# Patient Record
Sex: Male | Born: 1980 | Race: Black or African American | Hispanic: No | Marital: Single | Smoking: Former smoker
Health system: Southern US, Community
[De-identification: ages and names within clinical notes are randomized; demographics above are authoritative.]

## PROBLEM LIST (undated history)

## (undated) DIAGNOSIS — I471 Supraventricular tachycardia, unspecified: Secondary | ICD-10-CM

## (undated) DIAGNOSIS — I10 Essential (primary) hypertension: Secondary | ICD-10-CM

## (undated) DIAGNOSIS — U071 COVID-19: Secondary | ICD-10-CM

## (undated) DIAGNOSIS — E269 Hyperaldosteronism, unspecified: Secondary | ICD-10-CM

## (undated) HISTORY — PX: WISDOM TOOTH EXTRACTION: SHX21

## (undated) HISTORY — DX: COVID-19: U07.1

## (undated) HISTORY — DX: Hyperaldosteronism, unspecified: E26.9

---

## 2001-03-27 ENCOUNTER — Emergency Department (HOSPITAL_COMMUNITY): Admission: EM | Admit: 2001-03-27 | Discharge: 2001-03-27 | Payer: Self-pay | Admitting: Emergency Medicine

## 2001-05-11 ENCOUNTER — Emergency Department (HOSPITAL_COMMUNITY): Admission: EM | Admit: 2001-05-11 | Discharge: 2001-05-11 | Payer: Self-pay | Admitting: Emergency Medicine

## 2001-06-29 ENCOUNTER — Encounter: Payer: Self-pay | Admitting: Emergency Medicine

## 2001-06-29 ENCOUNTER — Emergency Department (HOSPITAL_COMMUNITY): Admission: EM | Admit: 2001-06-29 | Discharge: 2001-06-29 | Payer: Self-pay | Admitting: Emergency Medicine

## 2001-06-30 ENCOUNTER — Encounter: Payer: Self-pay | Admitting: Emergency Medicine

## 2001-06-30 ENCOUNTER — Ambulatory Visit (HOSPITAL_COMMUNITY): Admission: RE | Admit: 2001-06-30 | Discharge: 2001-06-30 | Payer: Self-pay | Admitting: Emergency Medicine

## 2003-06-18 ENCOUNTER — Emergency Department (HOSPITAL_COMMUNITY): Admission: EM | Admit: 2003-06-18 | Discharge: 2003-06-19 | Payer: Self-pay | Admitting: Emergency Medicine

## 2004-06-10 ENCOUNTER — Emergency Department (HOSPITAL_COMMUNITY): Admission: EM | Admit: 2004-06-10 | Discharge: 2004-06-10 | Payer: Self-pay | Admitting: Emergency Medicine

## 2005-04-12 ENCOUNTER — Emergency Department (HOSPITAL_COMMUNITY): Admission: EM | Admit: 2005-04-12 | Discharge: 2005-04-12 | Payer: Self-pay | Admitting: Emergency Medicine

## 2006-12-19 ENCOUNTER — Emergency Department (HOSPITAL_COMMUNITY): Admission: EM | Admit: 2006-12-19 | Discharge: 2006-12-19 | Payer: Self-pay | Admitting: Emergency Medicine

## 2008-01-21 ENCOUNTER — Emergency Department (HOSPITAL_COMMUNITY): Admission: EM | Admit: 2008-01-21 | Discharge: 2008-01-21 | Payer: Self-pay | Admitting: Emergency Medicine

## 2011-06-05 ENCOUNTER — Emergency Department (HOSPITAL_COMMUNITY)
Admission: EM | Admit: 2011-06-05 | Discharge: 2011-06-05 | Disposition: A | Discharge status: Home / Self Care | Payer: Self-pay | Attending: Emergency Medicine | Admitting: Emergency Medicine

## 2011-06-05 ENCOUNTER — Emergency Department (HOSPITAL_COMMUNITY): Payer: Self-pay

## 2011-06-05 ENCOUNTER — Encounter (HOSPITAL_COMMUNITY): Payer: Self-pay

## 2011-06-05 DIAGNOSIS — R110 Nausea: Secondary | ICD-10-CM | POA: Insufficient documentation

## 2011-06-05 DIAGNOSIS — R05 Cough: Secondary | ICD-10-CM | POA: Insufficient documentation

## 2011-06-05 DIAGNOSIS — J3489 Other specified disorders of nose and nasal sinuses: Secondary | ICD-10-CM | POA: Insufficient documentation

## 2011-06-05 DIAGNOSIS — R059 Cough, unspecified: Secondary | ICD-10-CM | POA: Insufficient documentation

## 2011-06-05 DIAGNOSIS — K5289 Other specified noninfective gastroenteritis and colitis: Secondary | ICD-10-CM | POA: Insufficient documentation

## 2011-06-05 DIAGNOSIS — R6883 Chills (without fever): Secondary | ICD-10-CM | POA: Insufficient documentation

## 2011-06-05 DIAGNOSIS — Z79899 Other long term (current) drug therapy: Secondary | ICD-10-CM | POA: Insufficient documentation

## 2011-06-05 DIAGNOSIS — IMO0001 Reserved for inherently not codable concepts without codable children: Secondary | ICD-10-CM | POA: Insufficient documentation

## 2011-06-05 DIAGNOSIS — K529 Noninfective gastroenteritis and colitis, unspecified: Secondary | ICD-10-CM

## 2011-06-05 MED ORDER — PROMETHAZINE-DM 6.25-15 MG/5ML PO SYRP: ORAL_SOLUTION | ORAL | Status: DC

## 2011-06-05 MED ORDER — PSEUDOEPHEDRINE HCL 60 MG PO TABS: ORAL_TABLET | ORAL | Status: DC

## 2011-06-05 MED ORDER — PREDNISONE 20 MG PO TABS
60.0000 mg | ORAL_TABLET | Freq: Once | ORAL | Status: AC
  Administered 2011-06-05: 60 mg via ORAL
  Filled 2011-06-05: qty 3

## 2011-06-05 MED ORDER — DEXAMETHASONE 4 MG PO TABS: ORAL_TABLET | ORAL | Status: AC

## 2011-06-05 MED ORDER — SODIUM CHLORIDE 0.9 % IV BOLUS (SEPSIS)
1000.0000 mL | Freq: Once | INTRAVENOUS | Status: AC
  Administered 2011-06-05: 1000 mL via INTRAVENOUS

## 2011-06-05 MED ORDER — PSEUDOEPHEDRINE HCL 60 MG PO TABS
60.0000 mg | ORAL_TABLET | Freq: Once | ORAL | Status: AC
  Administered 2011-06-05: 60 mg via ORAL
  Filled 2011-06-05: qty 1

## 2011-06-05 MED ORDER — HYDROCODONE-ACETAMINOPHEN 5-325 MG PO TABS
1.0000 | ORAL_TABLET | Freq: Once | ORAL | Status: AC
  Administered 2011-06-05: 1 via ORAL
  Filled 2011-06-05: qty 1

## 2011-06-05 MED ORDER — HYDROCODONE-ACETAMINOPHEN 5-325 MG PO TABS: 1.0000 | ORAL_TABLET | ORAL | Status: AC | PRN

## 2011-06-05 MED ORDER — PROMETHAZINE HCL 12.5 MG PO TABS
12.5000 mg | ORAL_TABLET | Freq: Once | ORAL | Status: AC
  Administered 2011-06-05: 12.5 mg via ORAL
  Filled 2011-06-05: qty 1

## 2011-06-05 MED ORDER — IBUPROFEN 800 MG PO TABS
800.0000 mg | ORAL_TABLET | Freq: Once | ORAL | Status: AC
  Administered 2011-06-05: 800 mg via ORAL
  Filled 2011-06-05: qty 1

## 2011-06-05 NOTE — ED Provider Notes (Addendum)
History     CSN: [id number]  Arrival date & time 06/05/11  1435   First MD Initiated Contact with Patient 06/05/11 1550      Chief Complaint  Patient presents with  . Generalized Body Aches  . Nausea  . Chills  . Cough    (Consider location/radiation/quality/duration/timing/severity/associated sxs/prior treatment) Patient is a 31 y.o. male presenting with cough. The history is provided by the patient.  Cough This is a new problem. The current episode started more than 2 days ago. The problem occurs constantly. The cough is non-productive. The maximum temperature recorded prior to his arrival was 101 to 101.9 F. Associated symptoms include chills, sweats, headaches and myalgias. Pertinent negatives include no chest pain, no shortness of breath and no wheezing. Associated symptoms comments: Nausea/vomiting. He has tried nothing for the symptoms. He is not a smoker. His past medical history is significant for bronchitis.    History reviewed. No pertinent past medical history.  History reviewed. No pertinent past surgical history.  No family history on file.  History  Substance Use Topics  . Smoking status: Never Smoker   . Smokeless tobacco: Not on file  . Alcohol Use: No      Review of Systems  Constitutional: Positive for chills. Negative for activity change.       All ROS Neg except as noted in HPI  HENT: Negative for nosebleeds and neck pain.   Eyes: Negative for photophobia and discharge.  Respiratory: Positive for cough. Negative for shortness of breath and wheezing.   Cardiovascular: Negative for chest pain and palpitations.  Gastrointestinal: Negative for abdominal pain and blood in stool.  Genitourinary: Negative for dysuria, frequency and hematuria.  Musculoskeletal: Positive for myalgias. Negative for back pain and arthralgias.  Skin: Negative.   Neurological: Positive for headaches. Negative for dizziness, seizures and speech difficulty.    Psychiatric/Behavioral: Negative for hallucinations and confusion.    Allergies  Penicillins  Home Medications   Current Outpatient Rx  Name Route Sig Dispense Refill  . DEXAMETHASONE 4 MG PO TABS  1 po daily with food 6 tablet 0  . HYDROCODONE-ACETAMINOPHEN 5-325 MG PO TABS Oral Take 1 tablet by mouth every 4 (four) hours as needed for pain. 15 tablet 0  . PROMETHAZINE-DM 6.25-15 MG/5ML PO SYRP  5 ml po q6h prn cough 120 mL 0  . PSEUDOEPHEDRINE HCL 60 MG PO TABS  1 po tid for congestion. 30 tablet 0    BP 136/82  Pulse 122  Temp(Src) 100.1 F (37.8 C) (Oral)  Resp 20  Ht 6' (1.829 m)  Wt 270 lb (122.471 kg)  BMI 36.62 kg/m2  SpO2 96%  Physical Exam  Nursing note and vitals reviewed. Constitutional: He is oriented to person, place, and time. He appears well-developed and well-nourished.  Non-toxic appearance.  HENT:  Head: Normocephalic.  Right Ear: Tympanic membrane and external ear normal.  Left Ear: Tympanic membrane and external ear normal.       Nasal congestion  Eyes: EOM and lids are normal. Pupils are equal, round, and reactive to light.  Neck: Normal range of motion. Neck supple. Carotid bruit is not present.  Cardiovascular: Regular rhythm, normal heart sounds, intact distal pulses and normal pulses.  Tachycardia present.   Pulmonary/Chest: No respiratory distress. He has rhonchi. He has no rales.       Few scattered rhonchi.  Abdominal: Soft. Bowel sounds are normal. There is no tenderness. There is no guarding.  Musculoskeletal: Normal range of motion.  Lymphadenopathy:       Head (right side): No submandibular adenopathy present.       Head (left side): No submandibular adenopathy present.    He has no cervical adenopathy.  Neurological: He is alert and oriented to person, place, and time. He has normal strength. No cranial nerve deficit or sensory deficit.  Skin: Skin is warm and dry.  Psychiatric: He has a normal mood and affect. His speech is normal.     ED Course  Procedures (including critical care time) Pulse oximetry 96% on room air. Within normal limits by my interpretation. Labs Reviewed - No data to display No results found.   1. Gastroenteritis       MDM  I have reviewed nursing notes, vital signs, and all appropriate lab and imaging results for this patient. Patient presented with a three-day history of bodyaches cough, congestion, nausea, and vomiting. He denies any hemoptysis or hematemesis. His highest temperature was 102 a couple days ago. He has been able to keep some liquids down at home, but not eating. Patient is ambulatory but feels weak. Patient feels he can keep liquids down if he has medication for the nausea. Prescription for Promethazine cough med. every 6 hours given. Hydrocodone 5 mg every 4 hours. Sudafed 60 mg 3 times a day and Decadron 4 mg daily also given. Patient advised to return to the emergency department if not improving or any complications.  At discharge, temp went up to 102. 1 liter of IV fluids given. Ibuprofen 800mg  given for fever. Chest xray obtained.  After IV fluids and ibuprofen, the temp and pulse rate improved. Chest xray reveals bronchitis changes only. It is safe for pt to be discharged. Pt to return if any changes or problem.    Kathie Dike, PA 06/05/11 1706  Kathie Dike, PA 06/06/11 7829  Kathie Dike, PA 06/15/11 5621

## 2011-06-05 NOTE — ED Notes (Signed)
Pt presents with productive cough, body aches, chills, and nausea since Tuesday.

## 2011-06-05 NOTE — ED Notes (Signed)
Sick for 3-4 days, cough,nausea, body aches, esp in low back.  Says he is able to drink flds but cannot  Eat due to nausea.

## 2011-06-05 NOTE — Discharge Instructions (Signed)
Please increase fluids. Please wash hands frequently. Decadron one tablet daily. Sudafed 60 mg 3 times daily for nasal congestion. Promethazine cough medication every 6 hours. Norco for body aches not improved by Tylenol or ibuprofen. The Norco and promethazine cough medication may cause drowsiness, please use with caution. Her a will and a little driver a to the a horrorsClear Liquid Diet The clear liquid dietconsists of foods that are liquid or will become liquid at room temperature.You should be able to see through the liquid and beverages. Examples of foods allowed on a clear liquid diet include fruit juice, broth or bouillon, gelatin, or frozen ice pops. The purpose of this diet is to provide necessary fluid, electrolytes such as sodium and potassium, and energy to keep the body functioning during times when you are not able to consume a regular diet.A clear liquid diet should not be continued for long periods of time as it is not nutritionally adequate.  REASONS FOR USING A CLEAR LIQUID DIET  In sudden onset (acute) conditions for a patient before or after surgery.   As the first step in oral feeding.   For fluid and electrolyte replacement in diarrheal diseases.   As a diet before certain medical tests are performed.  ADEQUACY The clear liquid diet is adequate only in ascorbic acid, according to the Recommended Dietary Allowances of the [employer]. CHOOSING FOODS Breads and Starches  Allowed:  None are allowed.   Avoid: All are avoided.  Vegetables  Allowed:  Strained tomato or vegetable juice.   Avoid: Any others.  Fruit  Allowed:  Strained fruit juices and fruit drinks. Include 1 serving of citrus or vitamin C-enriched fruit juice daily.   Avoid: Any others.  Meat and Meat Substitutes  Allowed:  None are allowed.   Avoid: All are avoided.  Milk  Allowed:  None are allowed.   Avoid: All are avoided.  Soups and Combination Foods  Allowed:  Clear  bouillon, broth, or strained broth-based soups.   Avoid: Any others.  Desserts and Sweets  Allowed:  Sugar, honey. High protein gelatin. Flavored gelatin, ices, or frozen ice pops that do not contain milk.   Avoid: Any others.  Fats and Oils  Allowed:  None are allowed.   Avoid: All are avoided.  Beverages  Allowed: Cereal beverages, coffee (regular or decaffeinated), tea, or soda at the discretion of your caregiver.   Avoid: Any others.  Condiments  Allowed:  Iodized salt.   Avoid: Any others, including pepper.  Supplements  Allowed:  Liquid nutrition beverages.   Avoid: Any others that contain lactose or fiber.  SAMPLE MEAL PLAN Breakfast  4 oz (120 mL) strained orange juice.    to 1 cup (125 to 250 mL) gelatin (plain or fortified).   1 cup (250 mL) beverage (coffee or tea).   Sugar, if desired.  Midmorning Snack   cup (125 mL) gelatin (plain or fortified).  Lunch  1 cup (250 mL) broth or consomm.   4 oz (120 mL) strained grapefruit juice.    cup (125 mL) gelatin (plain or fortified).   1 cup (250 mL) beverage (coffee or tea).   Sugar, if desired.  Midafternoon Snack   cup (125 mL) fruit ice.    cup (125 mL) strained fruit juice.  Dinner  1 cup (250 mL) broth or consomm.    cup (125 mL) cranberry juice.    cup (125 mL) flavored gelatin (plain or fortified).   1 cup (250 mL)  beverage (coffee or tea).   Sugar, if desired.  Evening Snack  4 oz (120 mL) strained apple juice (vitamin C-fortified).    cup (125 mL) flavored gelatin (plain or fortified).  Document Released: 03/16/2005 Document Revised: 03/05/2011 Document Reviewed: 06/13/2010 [facility] Patient Information 2012 [place], [place].

## 2011-06-06 NOTE — ED Provider Notes (Signed)
History     CSN: [id number]  Arrival date & time 06/05/11  1435   First MD Initiated Contact with Patient 06/05/11 1550      Chief Complaint  Patient presents with  . Generalized Body Aches  . Nausea  . Chills  . Cough    (Consider location/radiation/quality/duration/timing/severity/associated sxs/prior treatment) HPI  History reviewed. No pertinent past medical history.  History reviewed. No pertinent past surgical history.  No family history on file.  History  Substance Use Topics  . Smoking status: Never Smoker   . Smokeless tobacco: Not on file  . Alcohol Use: No      Review of Systems  Allergies  Penicillins  Home Medications   Current Outpatient Rx  Name Route Sig Dispense Refill  . DEXAMETHASONE 4 MG PO TABS  1 po daily with food 6 tablet 0  . HYDROCODONE-ACETAMINOPHEN 5-325 MG PO TABS Oral Take 1 tablet by mouth every 4 (four) hours as needed for pain. 15 tablet 0  . PROMETHAZINE-DM 6.25-15 MG/5ML PO SYRP  5 ml po q6h prn cough 120 mL 0  . PSEUDOEPHEDRINE HCL 60 MG PO TABS  1 po tid for congestion. 30 tablet 0    BP 129/75  Pulse 112  Temp(Src) 99.5 F (37.5 C) (Oral)  Resp 20  Ht 6' (1.829 m)  Wt 270 lb (122.471 kg)  BMI 36.62 kg/m2  SpO2 96%  Physical Exam  ED Course  Procedures (including critical care time)  Labs Reviewed - No data to display Dg Chest 2 View  06/05/2011  *RADIOLOGY REPORT*  Clinical Data: Cough and congestion.  CHEST - 2 VIEW  Comparison: 04/12/2005.  Findings: The cardiac silhouette, mediastinal and hilar contours are within normal limits and stable.  The lungs are clear of infiltrate or effusion.  There is mild peribronchial thickening which could suggest bronchitis.  No pleural effusion.  The bony thorax is intact.  IMPRESSION: Mild bronchitic changes could suggest bronchitis.  No focal infiltrates.  Original Report Authenticated By: P. Loralie Champagne, M.D.     1. Gastroenteritis       MDM  Duplicate  note        Doug Sou, MD 06/06/11 [phone]

## 2011-06-07 NOTE — ED Provider Notes (Signed)
Medical screening examination/treatment/procedure(s) were performed by non-physician practitioner and as supervising physician I was immediately available for consultation/collaboration.  Doug Sou, MD 06/07/11 [phone]

## 2011-06-15 NOTE — ED Provider Notes (Signed)
Medical screening examination/treatment/procedure(s) were performed by non-physician practitioner and as supervising physician I was immediately available for consultation/collaboration.  Doug Sou, MD 06/15/11 [phone]

## 2019-08-09 ENCOUNTER — Other Ambulatory Visit: Payer: Self-pay

## 2019-08-09 ENCOUNTER — Encounter (HOSPITAL_COMMUNITY): Payer: Self-pay | Admitting: Emergency Medicine

## 2019-08-09 ENCOUNTER — Emergency Department (HOSPITAL_COMMUNITY)
Admission: EM | Admit: 2019-08-09 | Discharge: 2019-08-10 | Disposition: A | Discharge status: Home / Self Care | Payer: Self-pay | Attending: Emergency Medicine | Admitting: Emergency Medicine

## 2019-08-09 DIAGNOSIS — Z5321 Procedure and treatment not carried out due to patient leaving prior to being seen by health care provider: Secondary | ICD-10-CM | POA: Insufficient documentation

## 2019-08-09 DIAGNOSIS — I10 Essential (primary) hypertension: Secondary | ICD-10-CM | POA: Insufficient documentation

## 2019-08-09 DIAGNOSIS — R519 Headache, unspecified: Secondary | ICD-10-CM | POA: Insufficient documentation

## 2019-08-09 NOTE — ED Provider Notes (Signed)
Patient eloped from the emergency department before provider evaluation. I did not see or evaluate this patient. Triage note states "Patient states that his blood pressure has been high today and states that he took a friends blood pressure. Patient states severe headache at this time."  Patient's blood pressure in triage was 150/84.    Kathyrn Lass 08/09/19 2310    Bethann Berkshire, MD 08/09/19 2316

## 2019-08-09 NOTE — ED Triage Notes (Signed)
Patient states that his blood pressure has been high today and states that he took a friends blood pressure. Patient states severe headache at this time.

## 2020-04-11 ENCOUNTER — Ambulatory Visit: Payer: Self-pay

## 2020-04-22 ENCOUNTER — Emergency Department (HOSPITAL_COMMUNITY)
Admission: EM | Admit: 2020-04-22 | Discharge: 2020-04-22 | Disposition: A | Discharge status: Home / Self Care | Payer: Self-pay | Attending: Emergency Medicine | Admitting: Emergency Medicine

## 2020-04-22 ENCOUNTER — Encounter (HOSPITAL_COMMUNITY): Payer: Self-pay | Admitting: Emergency Medicine

## 2020-04-22 ENCOUNTER — Other Ambulatory Visit: Payer: Self-pay

## 2020-04-22 ENCOUNTER — Emergency Department (HOSPITAL_COMMUNITY): Payer: Self-pay

## 2020-04-22 DIAGNOSIS — U071 COVID-19: Secondary | ICD-10-CM | POA: Insufficient documentation

## 2020-04-22 LAB — POC SARS CORONAVIRUS 2 AG -  ED
SARS Coronavirus 2 Ag: POSITIVE — AB
SARS Coronavirus 2 Ag: POSITIVE — AB

## 2020-04-22 MED ORDER — ALBUTEROL SULFATE HFA 108 (90 BASE) MCG/ACT IN AERS
2.0000 | INHALATION_SPRAY | Freq: Once | RESPIRATORY_TRACT | Status: AC
Start: 2020-04-22 — End: 2020-04-22
  Administered 2020-04-22: 2 via RESPIRATORY_TRACT
  Filled 2020-04-22: qty 6.7

## 2020-04-22 MED ORDER — IPRATROPIUM BROMIDE HFA 17 MCG/ACT IN AERS: 2.0000 | INHALATION_SPRAY | Freq: Once | RESPIRATORY_TRACT | Status: DC

## 2020-04-22 NOTE — ED Triage Notes (Signed)
Pt c/o of a productive cough and sob with ambulation since thursday

## 2020-04-22 NOTE — ED Notes (Signed)
Per provider order, patient ambulated with continuous pulse oximetry. This RN notes that patient appears short of breath, and patient reports chest tightness. SpO2 on room air maintains 95-98% and heart rate increased to 118bpm. Pt denies chest pain, dizziness or lightheadedness. PA made aware.

## 2020-04-22 NOTE — Discharge Instructions (Addendum)
You have tested POSITIVE for COVID 19 today. It is recommended that you self isolate at home for 5 days and if you are starting to feel better after day 5 you can resume normal daily activity with mask wearing at all times for an additional 5 days (per new CDC guidelines).   Use the albuterol inhaler as needed for your shortness of breath. Continue using OTC medications for your other symptoms. Drink plenty of fluids to stay hydrated.   It is recommended that you buy a pulse oximeter (can be bought off of Amazon or at a local store if in stock). If your oxygen level is persistently in the 80s at rest you will need to come back to the ED IMMEDIATELY for further evaluation.   Follow up with your PCP for same. If you do not have one you can follow up with [facility] for primary care needs.   Return to the ED for any worsening symptoms including worsening shortness of breath, severe chest pain, passing out, lips/fingers turning blue, inability to awaken easily, new onset confusion, or any other new/concerning symptoms.

## 2020-04-22 NOTE — ED Provider Notes (Signed)
[facility] EMERGENCY DEPARTMENT Provider Note   CSN: [id number] Arrival date & time: 04/22/20  0915     History Chief Complaint  Patient presents with  . Cough    Anthony Huff is a 40 y.o. male who presents to the ED today with complaint of fatigue, cough, and dyspnea on exertion for the past 4-5 days. Pt reports that his whole family is sick currently however none of them have been tested for COVID 19. His wife and sons were going to get tested around 1 PM today. Pt went to [facility] this morning to get medication for his wife given she is having symptoms when he felt like he could not catch his breath causing him concern; pt drove himself to the ED for further evaluation. He is vaccinated x 2 with most recent dose in July; no booster. Pt denies fevers, chills, chest pain, nausea, vomiting, diarrhea, or any other associated symptoms. Pt is a former smoker - quit smoking 1-2 months ago.   The history is provided by the patient and medical records.       History reviewed. No pertinent past medical history.  There are no problems to display for this patient.   History reviewed. No pertinent surgical history.     History reviewed. No pertinent family history.  Social History   Tobacco Use  . Smoking status: Never Smoker  . Smokeless tobacco: Never Used  Substance Use Topics  . Alcohol use: No  . Drug use: Not Currently    Types: Marijuana    Home Medications Prior to Admission medications   Medication Sig Start Date End Date Taking? Authorizing Provider  promethazine-dextromethorphan (PROMETHAZINE-DM) 6.25-15 MG/5ML syrup 5 ml po q6h prn cough 06/05/11   Ivery Quale, PA-C  pseudoephedrine (SUDAFED) 60 MG tablet 1 po tid for congestion. 06/05/11   Ivery Quale, PA-C    Allergies    Penicillins  Review of Systems   Review of Systems  Constitutional: Negative for chills and fever.  Respiratory: Positive for cough and shortness of breath.   Cardiovascular:  Negative for chest pain.  All other systems reviewed and are negative.   Physical Exam Updated Vital Signs BP (!) 154/104 (BP Location: Right Arm)   Pulse 100   Temp 97.9 F (36.6 C) (Oral)   Resp 18   Ht 6' (1.829 m)   Wt (!) 154.2 kg   SpO2 96%   BMI 46.11 kg/m   Physical Exam Vitals and nursing note reviewed.  Constitutional:      Appearance: He is obese. He is not ill-appearing or diaphoretic.     Comments: Tearful on exam  HENT:     Head: Normocephalic and atraumatic.  Eyes:     Conjunctiva/sclera: Conjunctivae normal.  Cardiovascular:     Rate and Rhythm: Normal rate and regular rhythm.     Pulses: Normal pulses.  Pulmonary:     Effort: Pulmonary effort is normal.     Breath sounds: Normal breath sounds. No stridor. No wheezing, rhonchi or rales.     Comments: Able to speak in full sentences without difficulty. Satting 96% on RA at rest. Pt was ambulated with O2 sats remaining above 94%. LCTAB. No active cough in the room.  Chest:     Chest wall: No tenderness.  Skin:    General: Skin is warm and dry.     Coloration: Skin is not jaundiced.  Neurological:     Mental Status: He is alert.     ED  Results / Procedures / Treatments   Labs (all labs ordered are listed, but only abnormal results are displayed) Labs Reviewed  POC SARS CORONAVIRUS 2 AG -  ED - Abnormal; Notable for the following components:      Result Value   SARS Coronavirus 2 Ag POSITIVE (*)    All other components within normal limits  POC SARS CORONAVIRUS 2 AG -  ED - Abnormal; Notable for the following components:   SARS Coronavirus 2 Ag POSITIVE (*)    All other components within normal limits    EKG None  Radiology DG Chest Port 1 View  Result Date: 04/22/2020 CLINICAL DATA:  Productive cough and shortness of breath. COVID positive today. EXAM: PORTABLE CHEST 1 VIEW COMPARISON:  06/05/2011. FINDINGS: Trachea is midline. Heart size is accentuated by AP apical lordotic technique. Lungs  are clear. No pleural fluid. IMPRESSION: No acute findings. Electronically Signed   By: Leanna Battles M.D.   On: 04/22/2020 10:20    Procedures Procedures   Medications Ordered in ED Medications  albuterol (VENTOLIN HFA) 108 (90 Base) MCG/ACT inhaler 2 puff (has no administration in time range)    ED Course  I have reviewed the triage vital signs and the nursing notes.  Pertinent labs & imaging results that were available during my care of the patient were reviewed by me and considered in my medical decision making (see chart for details).  Clinical Course as of 04/22/20 1116  Mon Apr 22, 2020  1009 SARS Coronavirus 2 Ag(!): POSITIVE [MV]    Clinical Course User Index [MV] [name], [place]   MDM Rules/Calculators/A&P                          40 year old male who presents to the ED today with complaint of cough, dyspnea on exertion, fatigue for the past several days.  Was at [facility] earlier today getting medication for his family members who are also sick when he felt like he could not get a deep breath and prompting him to come to the ED.  He was able to drive himself here without difficulty.  On arrival vitals are stable.  Patient is afebrile, nontachypneic.  Heart rate at 100 bpm, has decreased down to the 90s on exam.  Satting 96% on room air.  On exam patient appears to be in no acute distress however is tearful on exam.  He states he is worked up because his whole family is sick including his 89-month-old child.  He is able speak in full sentences without difficulty and his lungs are clear to auscultation bilaterally.  A point-of-care Covid test has been obtained while patient was in triage which has returned positive at this time.  Chest x-ray was also obtained which does not show any acute findings.  I personally ambulated patient in the room with pulse ox on his finger and he stayed above 94% on room air.  I do not feel patient needs additional labs or imaging at this time.   Will provide an albuterol inhaler given his complaints and discharged home with instructions to self isolate.  Patient is advised to follow-up with his PCP for same.  Strict return precautions have been discussed.  He is in agreement with plan and stable for discharge.   This note was prepared using Dragon voice recognition software and may include unintentional dictation errors due to the inherent limitations of voice recognition software.  [name] was evaluated  in Emergency Department on 04/22/2020 for the symptoms described in the history of present illness. He was evaluated in the context of the global COVID-19 pandemic, which necessitated consideration that the patient might be at risk for infection with the SARS-CoV-2 virus that causes COVID-19. Institutional protocols and algorithms that pertain to the evaluation of patients at risk for COVID-19 are in a state of rapid change based on information released by regulatory bodies including the CDC and federal and state organizations. These policies and algorithms were followed during the patient's care in the ED.  Final Clinical Impression(s) / ED Diagnoses Final diagnoses:  COVID-19    Rx / DC Orders ED Discharge Orders    None       Discharge Instructions     You have tested POSITIVE for COVID 19 today. It is recommended that you self isolate at home for 5 days and if you are starting to feel better after day 5 you can resume normal daily activity with mask wearing at all times for an additional 5 days (per new CDC guidelines).   Use the albuterol inhaler as needed for your shortness of breath. Continue using OTC medications for your other symptoms. Drink plenty of fluids to stay hydrated.   It is recommended that you buy a pulse oximeter (can be bought off of [employer] or at a local store if in stock). If your oxygen level is persistently in the 80s at rest you will need to come back to the ED IMMEDIATELY for further evaluation.    Follow up with your PCP for same. If you do not have one you can follow up with [facility] for primary care needs.   Return to the ED for any worsening symptoms including worsening shortness of breath, severe chest pain, passing out, lips/fingers turning blue, inability to awaken easily, new onset confusion, or any other new/concerning symptoms.        [name], PA-C 04/22/20 1116    Vanetta Mulders, MD 05/05/20 [phone]

## 2020-04-22 NOTE — ED Notes (Signed)
Entered room and introduced self to patient. Pt appears to be resting in bed, respirations are even and unlabored with equal chest rise and fall. Bed is locked in the lowest position, side rails x2, call bell within reach. Pt educated on call light use and hourly rounding, verbalized understanding and in agreement at this time. All questions and concerns voiced addressed. Refreshments offered and provided per patient request.  Will continue to monitor.   

## 2020-05-03 ENCOUNTER — Ambulatory Visit
Admission: EM | Admit: 2020-05-03 | Discharge: 2020-05-03 | Disposition: A | Discharge status: Home / Self Care | Payer: Self-pay

## 2020-05-03 ENCOUNTER — Other Ambulatory Visit: Payer: Self-pay

## 2020-08-04 ENCOUNTER — Emergency Department (HOSPITAL_COMMUNITY): Payer: Self-pay

## 2020-08-04 ENCOUNTER — Emergency Department (HOSPITAL_COMMUNITY)
Admission: EM | Admit: 2020-08-04 | Discharge: 2020-08-04 | Disposition: A | Discharge status: Home / Self Care | Payer: Self-pay | Attending: Emergency Medicine | Admitting: Emergency Medicine

## 2020-08-04 ENCOUNTER — Other Ambulatory Visit: Payer: Self-pay

## 2020-08-04 ENCOUNTER — Encounter (HOSPITAL_COMMUNITY): Payer: Self-pay | Admitting: Emergency Medicine

## 2020-08-04 DIAGNOSIS — E876 Hypokalemia: Secondary | ICD-10-CM

## 2020-08-04 DIAGNOSIS — R0602 Shortness of breath: Secondary | ICD-10-CM | POA: Insufficient documentation

## 2020-08-04 DIAGNOSIS — R000 Tachycardia, unspecified: Secondary | ICD-10-CM | POA: Insufficient documentation

## 2020-08-04 LAB — BASIC METABOLIC PANEL
Anion gap: 10 (ref 5–15)
BUN: 7 mg/dL (ref 6–20)
CO2: 26 mmol/L (ref 22–32)
Calcium: 9 mg/dL (ref 8.9–10.3)
Chloride: 105 mmol/L (ref 98–111)
Creatinine, Ser: 0.55 mg/dL — ABNORMAL LOW (ref 0.61–1.24)
GFR, Estimated: >60 mL/min (ref >60)
Glucose, Bld: 114 mg/dL — ABNORMAL HIGH (ref 70–99)
Potassium: 2.8 mmol/L — ABNORMAL LOW (ref 3.5–5.1)
Sodium: 141 mmol/L (ref 135–145)

## 2020-08-04 LAB — CBC
HCT: 48.9 % (ref 39.0–52.0)
Hemoglobin: 16.7 g/dL (ref 13.0–17.0)
MCH: 30.1 pg (ref 26.0–34.0)
MCHC: 34.2 g/dL (ref 30.0–36.0)
MCV: 88.3 fL (ref 80.0–100.0)
Platelets: 262 10*3/uL (ref 150–400)
RBC: 5.54 MIL/uL (ref 4.22–5.81)
RDW: 13 % (ref 11.5–15.5)
WBC: 8.2 10*3/uL (ref 4.0–10.5)
nRBC: 0 % (ref 0.0–0.2)

## 2020-08-04 LAB — TSH: TSH: 1.004 u[IU]/mL (ref 0.350–4.500)

## 2020-08-04 LAB — TROPONIN I (HIGH SENSITIVITY)
Troponin I (High Sensitivity): 8 ng/L (ref <18)
Troponin I (High Sensitivity): 8 ng/L (ref <18)

## 2020-08-04 LAB — D-DIMER, QUANTITATIVE: D-Dimer, Quant: 0.29 ug/mL-FEU (ref 0.00–0.50)

## 2020-08-04 MED ORDER — METOPROLOL TARTRATE 25 MG PO TABS: 25.0000 mg | ORAL_TABLET | Freq: Two times a day (BID) | ORAL | 0 refills | Status: DC

## 2020-08-04 MED ORDER — POTASSIUM CHLORIDE CRYS ER 20 MEQ PO TBCR: 20.0000 meq | EXTENDED_RELEASE_TABLET | Freq: Two times a day (BID) | ORAL | 0 refills | Status: DC

## 2020-08-04 MED ORDER — ADENOSINE 6 MG/2ML IV SOLN
INTRAVENOUS | Status: AC
  Filled 2020-08-04: qty 6

## 2020-08-04 MED ORDER — POTASSIUM CHLORIDE CRYS ER 20 MEQ PO TBCR
40.0000 meq | EXTENDED_RELEASE_TABLET | Freq: Once | ORAL | Status: AC
  Administered 2020-08-04: 40 meq via ORAL
  Filled 2020-08-04: qty 2

## 2020-08-04 MED ORDER — POTASSIUM CHLORIDE 10 MEQ/100ML IV SOLN
10.0000 meq | Freq: Once | INTRAVENOUS | Status: AC
  Administered 2020-08-04: 10 meq via INTRAVENOUS
  Filled 2020-08-04: qty 100

## 2020-08-04 MED ORDER — METOPROLOL TARTRATE 50 MG PO TABS
50.0000 mg | ORAL_TABLET | Freq: Once | ORAL | Status: AC
  Administered 2020-08-04: 50 mg via ORAL
  Filled 2020-08-04: qty 1

## 2020-08-04 MED ORDER — MAGNESIUM OXIDE 400 MG PO CAPS: 400.0000 mg | ORAL_CAPSULE | ORAL | 0 refills | Status: DC

## 2020-08-04 MED ORDER — METOPROLOL TARTRATE 5 MG/5ML IV SOLN
INTRAVENOUS | Status: AC
  Filled 2020-08-04: qty 5

## 2020-08-04 MED ORDER — MAGNESIUM SULFATE 2 GM/50ML IV SOLN
2.0000 g | Freq: Once | INTRAVENOUS | Status: AC
  Administered 2020-08-04: 2 g via INTRAVENOUS
  Filled 2020-08-04: qty 50

## 2020-08-04 NOTE — ED Triage Notes (Signed)
Pt reports intemittent "flutter" in chest with shortness of breath lasting about 1-2 hours. Previous episodes lasting only about 10 minutes. Denies any chest pain just "uncomfortable flutters."

## 2020-08-04 NOTE — ED Notes (Signed)
Pt placed on cardiac monitor with BP to set cycle every 30 minutes. Continuous pulse oximeter applied.  

## 2020-08-04 NOTE — ED Provider Notes (Signed)
Medical screening examination/treatment/procedure(s) were conducted as a shared visit with non-physician practitioner(s) and myself.  I personally evaluated the patient during the encounter.  Clinical Impression:   Final diagnoses:  Hypokalemia  SVT  This patient is a well-appearing 40 year old male, he presents with a couple of complaints including a feeling of some heart fluttering for a few hours, he has had some shortness of breath with this but has not had any chest pain.  There was a slight numbness in the left side of his chest.  He has never had any heart conditions, he has never had any chronic medical conditions and takes no daily medications.  He presents to the hospital with a pretty benign exam.  He has no edema of the legs, clear heart and lung sounds, heart rate is 85 on my exam, he is afebrile has a normal chest x-ray and his lab work shows hypokalemia of unknown etiology.  He will be given magnesium and potassium and discharged home to follow-up in the outpatient setting with Dr. Lodema Hong his family doctor.   Eber Hong, MD 08/06/20 [id number]

## 2020-08-04 NOTE — ED Notes (Signed)
Monitor alarming high heart rate; pt appears to be in SVT. ekg printed and given to edp. Dr Hyacinth Meeker to room to assess

## 2020-08-04 NOTE — ED Provider Notes (Signed)
[facility] EMERGENCY DEPARTMENT Provider Note   CSN: [id number] Arrival date & time: 08/04/20  1749     History Chief Complaint  Patient presents with  . Shortness of Breath    Anthony Huff is a 40 y.o. male.  HPI   Patient presents with heart fluttering x 2-3 hours. States this started while shopping at [employer]. It started acutely, and was associated with shortness of breath. He denies any chest pain, but states he felt numbness to the left chest. This has happened before, but only lasted for 15 minutes. He is concerned about heart attack because he has had multiple cousins die of heart attacks in their early fifties. He denies any nausea, vomiting, chest pain, leg swelling or difficulty breathing now.   History reviewed. No pertinent past medical history.  There are no problems to display for this patient.   History reviewed. No pertinent surgical history.     History reviewed. No pertinent family history.  Social History   Tobacco Use  . Smoking status: Never Smoker  . Smokeless tobacco: Never Used  Substance Use Topics  . Alcohol use: No  . Drug use: Not Currently    Types: Marijuana    Home Medications Prior to Admission medications   Not on File    Allergies    Penicillins  Review of Systems   Review of Systems  All other systems reviewed and are negative.   Physical Exam Updated Vital Signs BP (!) 151/92   Pulse 88   Temp 98.2 F (36.8 C) (Oral)   Resp 17   Ht 6' (1.829 m)   Wt (!) 161 kg   SpO2 97%   BMI 48.15 kg/m   Physical Exam Vitals and nursing note reviewed. Exam conducted with a chaperone present.  Constitutional:      Appearance: Normal appearance. He is obese.  HENT:     Head: Normocephalic and atraumatic.  Eyes:     General: No scleral icterus.       Right eye: No discharge.        Left eye: No discharge.     Extraocular Movements: Extraocular movements intact.     Pupils: Pupils are equal, round, and reactive to  light.  Cardiovascular:     Rate and Rhythm: Regular rhythm. Tachycardia present.     Pulses: Normal pulses.     Heart sounds: Normal heart sounds. No murmur heard. No friction rub. No gallop.   Pulmonary:     Effort: Pulmonary effort is normal. No respiratory distress.     Breath sounds: Normal breath sounds.     Comments: Speaking in full sentences Abdominal:     General: Abdomen is flat. Bowel sounds are normal. There is no distension.     Palpations: Abdomen is soft.     Tenderness: There is no abdominal tenderness.  Musculoskeletal:     Right lower leg: No tenderness. No edema.     Left lower leg: No tenderness. No edema.  Skin:    General: Skin is warm and dry.     Coloration: Skin is not jaundiced.  Neurological:     Mental Status: He is alert. Mental status is at baseline.     Coordination: Coordination normal.     ED Results / Procedures / Treatments   Labs (all labs ordered are listed, but only abnormal results are displayed) Labs Reviewed  BASIC METABOLIC PANEL - Abnormal; Notable for the following components:      Result  Value   Potassium 2.8 (*)    Glucose, Bld 114 (*)    Creatinine, Ser 0.55 (*)    All other components within normal limits  CBC  D-DIMER, QUANTITATIVE  TSH  TROPONIN I (HIGH SENSITIVITY)  TROPONIN I (HIGH SENSITIVITY)    EKG EKG Interpretation  Date/Time:  Sunday Aug 04 2020 17:56:35 EDT Ventricular Rate:  114 PR Interval:  162 QRS Duration: 102 QT Interval:  334 QTC Calculation: 460 R Axis:   101 Text Interpretation: Sinus tachycardia with occasional and consecutive Premature ventricular complexes Rightward axis Abnormal ECG No old tracing to compare Confirmed by Eber Hong ([id number]) on 08/04/2020 6:09:12 PM   Radiology DG Chest 2 View  Result Date: 08/04/2020 CLINICAL DATA:  Intermittent chest pain and shortness of breath EXAM: CHEST - 2 VIEW COMPARISON:  04/22/2020 FINDINGS: The heart size and mediastinal contours are within  normal limits. Both lungs are clear. The visualized skeletal structures are unremarkable. IMPRESSION: No active cardiopulmonary disease. Electronically Signed   By: Alcide Clever M.D.   On: 08/04/2020 18:41    Procedures .Critical Care Performed by: Theron Arista, PA-C Authorized by: Theron Arista, PA-C   Critical care provider statement:    Critical care time (minutes):  35   Critical care time was exclusive of:  Separately billable procedures and treating other patients   Critical care was necessary to treat or prevent imminent or life-threatening deterioration of the following conditions:  Cardiac failure (Life threatening arrhythmia )   Critical care was time spent personally by me on the following activities:  Discussions with consultants, evaluation of patient's response to treatment, examination of patient, ordering and performing treatments and interventions, ordering and review of laboratory studies, ordering and review of radiographic studies, pulse oximetry, re-evaluation of patient's condition, obtaining history from patient or surrogate, review of old charts and development of treatment plan with patient or surrogate     Medications Ordered in ED Medications  potassium chloride 10 mEq in 100 mL IVPB (has no administration in time range)  potassium chloride SA (KLOR-CON) CR tablet 40 mEq (has no administration in time range)  magnesium sulfate IVPB 2 g 50 mL (has no administration in time range)    ED Course  I have reviewed the triage vital signs and the nursing notes.  Pertinent labs & imaging results that were available during my care of the patient were reviewed by me and considered in my medical decision making (see chart for details).    MDM Rules/Calculators/A&P                          Patient is a 40 year old male seen today for heart fluttering. Hemodynamically stable, nontoxic appearing. PE reassuring.   Labs:   D-Dimer: negative. low suspicion for PE  Troponin:  8. Unconcerned for ACS  TSH: 1.004. negative for hyperthyroid or thyroid storm.   BMP: Low potassium 2.8. Hypokalemic. Patient started on IV potassium and magnesium in the ED. Will start on outpatient potassium supplementation for two weeks.   Imaging:  CXR: no acute pathology  EKG: PVC couplets. No AFL/AF/VT/SVT  A/P: Doubt infectious, thyroid, ACS, PE, PNA, GERD. Will start treatment of hypokalemia here in the ED with plans to have the patient continue treatment outpatient with oral supplementations. Strict return precautions given. Patient is in agreement with the plan.   9:10 PM - Patient had a change in status. Went in SVT 180-200s. Patient given 6 mg  of adenosine and converted back to sinus rhythm. He was then given 5 mg of lopressor. He is currently being observed.   10:52 PM - patient is stable. Has been NSR since 9:10. Will give one oral dose of Lopressor 50 mg and discharge home.   When patient is discharged, will have him follow up with cardiology within 3 days. Referral placed. Started patient on Lopressor 25 mg twice daily. Shared decision making was done.   Discussed HPI, physical exam and plan of care for this patient with attending Eber Hong. The attending physician evaluated this patient as part of a shared visit and agrees with plan of care.    Final Clinical Impression(s) / ED Diagnoses Final diagnoses:  None    Rx / DC Orders ED Discharge Orders    None       Theron Arista, Cordelia Poche 08/04/20 2253    Eber Hong, MD 08/06/20 1530

## 2020-08-04 NOTE — Discharge Instructions (Addendum)
You were treated today for Hypokalemia (low potassium) Take potassium supplements twice daily for the next week. Take 400 mg of Magnesium twice daily for the next week. Continue to eat leafy greens and black beans.   You went into SVT when in the ER. Please take Lopressor twice daily. Once in the in the morning, once at night. You will also need to follow up with a cardiologist. A referral has been made. Please call now to follow up within 3 days.

## 2020-09-04 ENCOUNTER — Telehealth: Payer: Self-pay | Admitting: *Deleted

## 2020-09-04 NOTE — Telephone Encounter (Signed)
Mother in office today and discussed with Diona Browner that patient was recently in ED for SVT and did not have refills on medications. Advised mother that we did not have permission to speak with her but patient would be contacted directly with plan. Per Diona Browner, can give refills on all medications and have patient do lab work before visit in July to check BMET. Left message for patient to call office.

## 2020-10-08 ENCOUNTER — Ambulatory Visit (INDEPENDENT_AMBULATORY_CARE_PROVIDER_SITE_OTHER): Payer: Self-pay | Admitting: Cardiology

## 2020-10-08 ENCOUNTER — Other Ambulatory Visit (HOSPITAL_COMMUNITY)
Admission: RE | Admit: 2020-10-08 | Discharge: 2020-10-08 | Disposition: A | Discharge status: Home / Self Care | Payer: Self-pay | Source: Ambulatory Visit | Attending: Cardiology | Admitting: Cardiology

## 2020-10-08 ENCOUNTER — Other Ambulatory Visit: Payer: Self-pay

## 2020-10-08 ENCOUNTER — Telehealth: Payer: Self-pay

## 2020-10-08 ENCOUNTER — Encounter: Payer: Self-pay | Admitting: Cardiology

## 2020-10-08 VITALS — BP 146/78 | HR 94 | Ht 72.0 in | Wt 355.0 lb

## 2020-10-08 DIAGNOSIS — E876 Hypokalemia: Secondary | ICD-10-CM

## 2020-10-08 DIAGNOSIS — Z8639 Personal history of other endocrine, nutritional and metabolic disease: Secondary | ICD-10-CM | POA: Insufficient documentation

## 2020-10-08 DIAGNOSIS — I471 Supraventricular tachycardia: Secondary | ICD-10-CM

## 2020-10-08 LAB — BASIC METABOLIC PANEL
Anion gap: 8 (ref 5–15)
BUN: 8 mg/dL (ref 6–20)
CO2: 27 mmol/L (ref 22–32)
Calcium: 8.6 mg/dL — ABNORMAL LOW (ref 8.9–10.3)
Chloride: 103 mmol/L (ref 98–111)
Creatinine, Ser: 0.59 mg/dL — ABNORMAL LOW (ref 0.61–1.24)
GFR, Estimated: >60 mL/min (ref >60)
Glucose, Bld: 115 mg/dL — ABNORMAL HIGH (ref 70–99)
Potassium: 2.9 mmol/L — ABNORMAL LOW (ref 3.5–5.1)
Sodium: 138 mmol/L (ref 135–145)

## 2020-10-08 MED ORDER — METOPROLOL SUCCINATE ER 50 MG PO TB24: 50.0000 mg | ORAL_TABLET | Freq: Every day | ORAL | 11 refills | Status: DC

## 2020-10-08 NOTE — Telephone Encounter (Signed)
I spoke with patient, lab results discussed. I place referral to nephrology. Patient will expect call to schedule appointment.

## 2020-10-08 NOTE — Patient Instructions (Signed)
Medication Instructions:   STOP Lopressor   START Toprol XL 50 mg daily    *If you need a refill on your cardiac medications before your next appointment, please call your pharmacy*   Lab Work:  BMET today     If you have labs (blood work) drawn today and your tests are completely normal, you will receive your results only by: MyChart Message (if you have MyChart) OR A paper copy in the mail If you have any lab test that is abnormal or we need to change your treatment, we will call you to review the results.   Testing/Procedures: Your physician has requested that you have an echocardiogram. Echocardiography is a painless test that uses sound waves to create images of your heart. It provides your doctor with information about the size and shape of your heart and how well your heart's chambers and valves are working. This procedure takes approximately one hour. There are no restrictions for this procedure.    Follow-Up: At [facility], you and your health needs are our priority.  As part of our continuing mission to provide you with exceptional heart care, we have created designated Provider Care Teams.  These Care Teams include your primary Cardiologist (physician) and Advanced Practice Providers (APPs -  Physician Assistants and Nurse Practitioners) who all work together to provide you with the care you need, when you need it.  We recommend signing up for the patient portal called "MyChart".  Sign up information is provided on this After Visit Summary.  MyChart is used to connect with patients for Virtual Visits (Telemedicine).  Patients are able to view lab/test results, encounter notes, upcoming appointments, etc.  Non-urgent messages can be sent to your provider as well.   To learn more about what you can do with MyChart, go to [email or web address].    Your next appointment:   3 month(s)  The format for your next appointment:   In Person  Provider:   You may see  Nona Dell, MD or one of the following Advanced Practice Providers on your designated Care Team:   Randall An, PA-C  Jacolyn Reedy, PA-C    Other Instructions None

## 2020-10-08 NOTE — Progress Notes (Signed)
Cardiology Office Note  Date: 10/08/2020   ID: Anthony Huff, DOB Jul 27, 1980, MRN [id number]  PCP:  Patient, No Pcp Per (Inactive)  Cardiologist:  Nona Dell, MD Electrophysiologist:  None   Chief Complaint  Patient presents with   Palpitations     History of Present Illness: Anthony Huff is a 40 y.o. male referred for cardiology consultation by Dr. Hyacinth Meeker after ER visit back in May with palpitations and documented SVT.  Records indicate hypokalemia at presentation, SVT treated with adenosine and IV Lopressor.  He was discharged from the ER on Lopressor 25 mg twice daily, potassium and magnesium supplement.  I reviewed his tracings from May.  Initial ECG showed sinus tachycardia with ventricular couplet and otherwise normal intervals.  Subsequent tracing showed SVT at 169 bpm.  He reports no major medical conditions, did have COVID-19 in January.  He is in the process of establishing with a PCP.  He took the Lopressor eventually only once a day to preserve pills since he did not have a follow-up visit scheduled to get a refill.  No longer taking potassium or magnesium.  He reports a general sense of palpitations perhaps as frequently as once a week, limited, and better when he takes Lopressor.  Past Medical History:  Diagnosis Date   COVID-17 April 2020    Past Surgical History:  Procedure Laterality Date   WISDOM TOOTH EXTRACTION      Current Outpatient Medications  Medication Sig Dispense Refill   Magnesium Oxide 400 MG CAPS Take 1 capsule (400 mg total) by mouth 2 (two) times daily at 7 am and 12 noon. 14 capsule 0   metoprolol succinate (TOPROL-XL) 50 MG 24 hr tablet Take 1 tablet (50 mg total) by mouth daily. Take with or immediately following a meal. 30 tablet 11   potassium chloride SA (KLOR-CON) 20 MEQ tablet Take 1 tablet (20 mEq total) by mouth 2 (two) times daily. (Patient not taking: Reported on 10/08/2020) 14 tablet 0   No current  facility-administered medications for this visit.   Allergies:  Penicillins   Social History: The patient  reports that he quit smoking about 18 months ago. His smoking use included cigarettes. He started smoking about 15 years ago. He has never used smokeless tobacco. He reports previous drug use. Drug: Marijuana. He reports that he does not drink alcohol.   Family History: The patient's family history includes Cancer in his father; Diabetes in his mother; Heart failure in his father and mother.   ROS: No syncope.  Physical Exam: VS:  BP (!) 146/78   Pulse 94   Ht 6' (1.829 m)   Wt (!) 355 lb (161 kg)   SpO2 97%   BMI 48.15 kg/m , BMI Body mass index is 48.15 kg/m.  Wt Readings from Last 3 Encounters:  10/08/20 (!) 355 lb (161 kg)  08/04/20 (!) 355 lb (161 kg)  04/22/20 (!) 340 lb (154.2 kg)    General: Patient appears comfortable at rest. HEENT: Conjunctiva and lids normal, wearing a mask. Neck: Supple, no elevated JVP or carotid bruits, no thyromegaly. Lungs: Clear to auscultation, nonlabored breathing at rest. Cardiac: Regular rate and rhythm, no S3 or significant systolic murmur, no pericardial rub. Abdomen: Soft, nontender, bowel sounds present. Extremities: No pitting edema, distal pulses 2+. Skin: Warm and dry. Musculoskeletal: No kyphosis. Neuropsychiatric: Alert and oriented x3, affect grossly appropriate.  ECG:   Tracings discussed above.  Recent Labwork: 08/04/2020: BUN 7;  Creatinine, Ser 0.55; Hemoglobin 16.7; Platelets 262; Potassium 2.8; Sodium 141; TSH 1.004   Other Studies Reviewed Today:  Chest x-ray 08/04/2020: FINDINGS: The heart size and mediastinal contours are within normal limits. Both lungs are clear. The visualized skeletal structures are unremarkable.   IMPRESSION: No active cardiopulmonary disease.  Assessment and Plan:  1.  PSVT, episode documented in May.  At this point would recommend switching beta-blocker to Toprol-XL 50 mg daily, we  will also obtain an echocardiogram for cardiac structural assessment.  2.  Hypokalemia noted at ER presentation in May, etiology not certain.  He is not on standing diuretics.  States that he had been exercising and sweating around that time.  Plan to recheck BMET.  3.  Health maintenance, recommend establishing with PCP.  Medication Adjustments/Labs and Tests Ordered: Current medicines are reviewed at length with the patient today.  Concerns regarding medicines are outlined above.   Tests Ordered: Orders Placed This Encounter  Procedures   Basic metabolic panel     Medication Changes: Meds ordered this encounter  Medications   metoprolol succinate (TOPROL-XL) 50 MG 24 hr tablet    Sig: Take 1 tablet (50 mg total) by mouth daily. Take with or immediately following a meal.    Dispense:  30 tablet    Refill:  11    10/08/20 switched to Toprol     Disposition:  Follow up  3 months.  Signed, Jonelle Sidle, MD, [facility] 10/08/2020 9:52 AM    [facility] [address] [place], [place] [address] Phone: [phone]; Fax: [phone]

## 2020-10-08 NOTE — Telephone Encounter (Signed)
-----   Message from Jonelle Sidle, MD sent at 10/08/2020 12:02 PM EDT ----- Results reviewed.  Please let him know that his potassium level is back down again off supplements.  Potassium 2.9 similar to when it was checked in May.  He is not on any specific therapy that should be reducing his potassium level and did not report any other obvious symptoms to suggest potassium loss through GI source.  He does not have a PCP at this time.  Please refer him to nephrology in the near future for further work-up of hypokalemia, mainly to exclude adrenal disease or renal tubular acidosis.

## 2020-10-21 ENCOUNTER — Ambulatory Visit (HOSPITAL_COMMUNITY)
Admission: RE | Admit: 2020-10-21 | Discharge: 2020-10-21 | Disposition: A | Discharge status: Home / Self Care | Payer: Self-pay | Source: Ambulatory Visit | Attending: Cardiology | Admitting: Cardiology

## 2020-10-21 ENCOUNTER — Other Ambulatory Visit: Payer: Self-pay

## 2020-10-21 DIAGNOSIS — I471 Supraventricular tachycardia: Secondary | ICD-10-CM | POA: Insufficient documentation

## 2020-10-21 LAB — ECHOCARDIOGRAM COMPLETE
Area-P 1/2: 2.47 cm2
S' Lateral: 3.6 cm

## 2020-10-21 NOTE — Progress Notes (Signed)
*  PRELIMINARY RESULTS* Echocardiogram 2D Echocardiogram has been performed.  Stacey Drain 10/21/2020, 11:24 AM

## 2020-10-23 NOTE — Telephone Encounter (Signed)
Toprol xl refill sent already

## 2021-01-08 NOTE — Progress Notes (Deleted)
Cardiology Office Note    Date:  01/08/2021   ID:  Anthony Huff, DOB February 18, 1981, MRN [id number]  PCP:  Patient, No Pcp Per (Inactive)  Cardiologist: Nona Dell, MD    No chief complaint on file.   History of Present Illness:    Anthony Huff is a 40 y.o. male with past medical history of SVT (diagnosed in 07/2020) who presents to the office today for 1-month follow-up.   He was examined by Dr. Diona Browner in 09/2020 as a new patient referral for SVT following an ER evaluation in 07/2020 during which she converted back to normal sinus rhythm with Adenosine and Lopressor. He was transitioned to Toprol-XL 50 mg daily an echocardiogram was ordered for further evaluation. His echocardiogram showed a preserved EF of 60 to 65% with no regional wall motion normalities. He did have mild LVH and trivial MR. His potassium remained low at 2.9 and it was recommended that he follow-up with Nephrology given his persistent hypokalemia.  - BMET    Past Medical History:  Diagnosis Date   COVID-17 April 2020    Past Surgical History:  Procedure Laterality Date   WISDOM TOOTH EXTRACTION      Current Medications: Outpatient Medications Prior to Visit  Medication Sig Dispense Refill   Magnesium Oxide 400 MG CAPS Take 1 capsule (400 mg total) by mouth 2 (two) times daily at 7 am and 12 noon. 14 capsule 0   metoprolol succinate (TOPROL-XL) 50 MG 24 hr tablet Take 1 tablet (50 mg total) by mouth daily. Take with or immediately following a meal. 30 tablet 11   potassium chloride SA (KLOR-CON) 20 MEQ tablet Take 1 tablet (20 mEq total) by mouth 2 (two) times daily. (Patient not taking: Reported on 10/08/2020) 14 tablet 0   No facility-administered medications prior to visit.     Allergies:   Penicillins   Social History   Socioeconomic History   Marital status: Single    Spouse name: Not on file   Number of children: Not on file   Years of education: Not on file   Highest  education level: Not on file  Occupational History   Not on file  Tobacco Use   Smoking status: Former    Types: Cigarettes    Start date: 03/31/2005    Quit date: 03/31/2019    Years since quitting: 1.7   Smokeless tobacco: Never  Vaping Use   Vaping Use: Never used  Substance and Sexual Activity   Alcohol use: No   Drug use: Not Currently    Types: Marijuana   Sexual activity: Yes  Other Topics Concern   Not on file  Social History Narrative   Not on file   Social Determinants of Health   Financial Resource Strain: Not on file  Food Insecurity: Not on file  Transportation Needs: Not on file  Physical Activity: Not on file  Stress: Not on file  Social Connections: Not on file     Family History:  The patient's ***family history includes Cancer in his father; Diabetes in his mother; Heart failure in his father and mother.   Review of Systems:    Please see the history of present illness.     All other systems reviewed and are otherwise negative except as noted above.   Physical Exam:    VS:  There were no vitals taken for this visit.   General: Well developed, well nourished,male appearing in no acute distress.  Head: Normocephalic, atraumatic. Neck: No carotid bruits. JVD not elevated.  Lungs: Respirations regular and unlabored, without wheezes or rales.  Heart: ***Regular rate and rhythm. No S3 or S4.  No murmur, no rubs, or gallops appreciated. Abdomen: Appears non-distended. No obvious abdominal masses. Msk:  Strength and tone appear normal for age. No obvious joint deformities or effusions. Extremities: No clubbing or cyanosis. No edema.  Distal pedal pulses are 2+ bilaterally. Neuro: Alert and oriented X 3. Moves all extremities spontaneously. No focal deficits noted. Psych:  Responds to questions appropriately with a normal affect. Skin: No rashes or lesions noted  Wt Readings from Last 3 Encounters:  10/08/20 (!) 355 lb (161 kg)  08/04/20 (!) 355 lb (161  kg)  04/22/20 (!) 340 lb (154.2 kg)        Studies/Labs Reviewed:   EKG:  EKG is*** ordered today.  The ekg ordered today demonstrates ***  Recent Labs: 08/04/2020: Hemoglobin 16.7; Platelets 262; TSH 1.004 10/08/2020: BUN 8; Creatinine, Ser 0.59; Potassium 2.9; Sodium 138   Lipid Panel No results found for: CHOL, TRIG, HDL, CHOLHDL, VLDL, LDLCALC, LDLDIRECT  Additional studies/ records that were reviewed today include:   Echocardiogram: 10/21/2020 IMPRESSIONS     1. Left ventricular ejection fraction, by estimation, is 60 to 65%. The  left ventricle has normal function. The left ventricle has no regional  wall motion abnormalities. The left ventricular internal cavity size was  moderately dilated. There is mild  left ventricular hypertrophy. Left ventricular diastolic parameters were  normal.   2. Right ventricular systolic function is normal. The right ventricular  size is normal.   3. The mitral valve is normal in structure. Trivial mitral valve  regurgitation.   4. The aortic valve is tricuspid. Aortic valve regurgitation is not  visualized.   5. The inferior vena cava is normal in size with greater than 50%  respiratory variability, suggesting right atrial pressure of 3 mmHg.   Assessment:    No diagnosis found.   Plan:   In order of problems listed above:  ***    Shared Decision Making/Informed Consent:   {Are you ordering a CV Procedure (e.g. stress test, cath, DCCV, TEE, etc)?   Press F2        :[id number]}    Medication Adjustments/Labs and Tests Ordered: Current medicines are reviewed at length with the patient today.  Concerns regarding medicines are outlined above.  Medication changes, Labs and Tests ordered today are listed in the Patient Instructions below. There are no Patient Instructions on file for this visit.   Signed, Ellsworth Lennox, PA-C  01/08/2021 7:48 PM    [facility] [address] [place], [place]  [address] Phone: [phone] Fax: [phone]

## 2021-01-09 ENCOUNTER — Ambulatory Visit: Payer: Self-pay | Admitting: Student

## 2021-02-06 ENCOUNTER — Ambulatory Visit
Admission: EM | Admit: 2021-02-06 | Discharge: 2021-02-06 | Disposition: A | Discharge status: Home / Self Care | Payer: Self-pay | Attending: Family Medicine | Admitting: Family Medicine

## 2021-02-06 ENCOUNTER — Encounter: Payer: Self-pay | Admitting: Emergency Medicine

## 2021-02-06 ENCOUNTER — Other Ambulatory Visit: Payer: Self-pay

## 2021-02-06 DIAGNOSIS — J069 Acute upper respiratory infection, unspecified: Secondary | ICD-10-CM

## 2021-02-06 DIAGNOSIS — Z20828 Contact with and (suspected) exposure to other viral communicable diseases: Secondary | ICD-10-CM | POA: Diagnosis not present

## 2021-02-06 MED ORDER — PROMETHAZINE-DM 6.25-15 MG/5ML PO SYRP: 5.0000 mL | ORAL_SOLUTION | Freq: Four times a day (QID) | ORAL | 0 refills | Status: DC | PRN

## 2021-02-06 MED ORDER — ONDANSETRON 4 MG PO TBDP
4.0000 mg | ORAL_TABLET | Freq: Once | ORAL | Status: AC
  Administered 2021-02-06: 4 mg via ORAL

## 2021-02-06 NOTE — ED Provider Notes (Signed)
  [facility]   [id number] 02/06/21 Arrival Time: 1308  ASSESSMENT & PLAN:  1. Exposure to the flu   2. Exposure to respiratory syncytial virus (RSV)   3. Viral URI with cough    Suspicious for influenza. Discussed typical duration of viral illnesses. Viral testing sent. OTC symptom care as needed.  Meds ordered this encounter  Medications   ondansetron (ZOFRAN-ODT) disintegrating tablet 4 mg   promethazine-dextromethorphan (PROMETHAZINE-DM) 6.25-15 MG/5ML syrup    Sig: Take 5 mLs by mouth 4 (four) times daily as needed for cough.    Dispense:  118 mL    Refill:  0     Follow-up Information     [facility] at [facility].   Specialty: Urgent Care Why: As needed. Contact information: [address], [address] [place] [address] [phone]                Reviewed expectations re: course of current medical issues. Questions answered. Outlined signs and symptoms indicating need for more acute intervention. Understanding verbalized. After Visit Summary given.   SUBJECTIVE: History from: patient. Anthony Huff is a 40 y.o. male who reports: cough, post-tussive emesis, body aches; x 3-4 days; son with influenza. Denies: difficulty breathing. Normal PO intake without n/v/d.   OBJECTIVE:  Vitals:   02/06/21 1503  BP: 122/89  Pulse: (!) 110  Resp: 18  Temp: 99.6 F (37.6 C)  TempSrc: Oral  SpO2: 94%    Slight tachycardia noted.  General appearance: alert; no distress Eyes: PERRLA; EOMI; conjunctiva normal HENT: [name]; AT; with nasal congestion Neck: supple  Lungs: speaks full sentences without difficulty; unlabored; clear Extremities: no edema Skin: warm and dry Neurologic: normal gait Psychological: alert and cooperative; normal mood and affect  Labs:  Labs Reviewed  COVID-19, FLU A+B AND RSV     Allergies  Allergen Reactions   Penicillins Shortness Of Breath    Past Medical History:  Diagnosis  Date   COVID-17 April 2020   Social History   Socioeconomic History   Marital status: Single    Spouse name: Not on file   Number of children: Not on file   Years of education: Not on file   Highest education level: Not on file  Occupational History   Not on file  Tobacco Use   Smoking status: Former    Types: Cigarettes    Start date: 03/31/2005    Quit date: 03/31/2019    Years since quitting: 1.8   Smokeless tobacco: Never  Vaping Use   Vaping Use: Never used  Substance and Sexual Activity   Alcohol use: No   Drug use: Not Currently    Types: Marijuana   Sexual activity: Yes  Other Topics Concern   Not on file  Social History Narrative   Not on file   Social Determinants of Health   Financial Resource Strain: Not on file  Food Insecurity: Not on file  Transportation Needs: Not on file  Physical Activity: Not on file  Stress: Not on file  Social Connections: Not on file  Intimate Partner Violence: Not on file   Family History  Problem Relation Age of Onset   Heart failure Mother    Diabetes Mother    Cancer Father    Heart failure Father    Past Surgical History:  Procedure Laterality Date   WISDOM TOOTH EXTRACTION       Anthony Layman, MD 02/06/21 1526

## 2021-02-06 NOTE — ED Triage Notes (Signed)
Patient c/o emesis, productive cough, and generalized body aches x 4 days.   Patient denies fever at home.   Patient endorses chest congestion.   Patient endorses sweating at night.   Patient endorses one of his children have the Flu and another child has RSV.   Patient has taken OTC TheraFlu, Claritin, and Decongestant.

## 2021-02-07 LAB — COVID-19, FLU A+B AND RSV
Influenza A, NAA: DETECTED — AB
Influenza B, NAA: NOT DETECTED
RSV, NAA: NOT DETECTED
SARS-CoV-2, NAA: NOT DETECTED

## 2021-02-22 ENCOUNTER — Other Ambulatory Visit: Payer: Self-pay

## 2021-02-22 ENCOUNTER — Emergency Department (HOSPITAL_COMMUNITY): Payer: Medicaid Other

## 2021-02-22 ENCOUNTER — Encounter (HOSPITAL_COMMUNITY): Payer: Self-pay | Admitting: *Deleted

## 2021-02-22 ENCOUNTER — Observation Stay (HOSPITAL_COMMUNITY)
Admission: EM | Admit: 2021-02-22 | Discharge: 2021-02-23 | Disposition: A | Discharge status: Home / Self Care | Payer: Medicaid Other | Attending: Family Medicine | Admitting: Family Medicine

## 2021-02-22 DIAGNOSIS — E269 Hyperaldosteronism, unspecified: Secondary | ICD-10-CM

## 2021-02-22 DIAGNOSIS — Z87891 Personal history of nicotine dependence: Secondary | ICD-10-CM | POA: Diagnosis not present

## 2021-02-22 DIAGNOSIS — E876 Hypokalemia: Secondary | ICD-10-CM

## 2021-02-22 DIAGNOSIS — I471 Supraventricular tachycardia: Principal | ICD-10-CM | POA: Insufficient documentation

## 2021-02-22 DIAGNOSIS — Z20822 Contact with and (suspected) exposure to covid-19: Secondary | ICD-10-CM | POA: Diagnosis not present

## 2021-02-22 DIAGNOSIS — Z9114 Patient's other noncompliance with medication regimen: Secondary | ICD-10-CM

## 2021-02-22 HISTORY — DX: Supraventricular tachycardia: I47.1

## 2021-02-22 HISTORY — DX: Supraventricular tachycardia, unspecified: I47.10

## 2021-02-22 LAB — CBC
HCT: 42.3 % (ref 39.0–52.0)
Hemoglobin: 14.4 g/dL (ref 13.0–17.0)
MCH: 30.3 pg (ref 26.0–34.0)
MCHC: 34 g/dL (ref 30.0–36.0)
MCV: 88.9 fL (ref 80.0–100.0)
Platelets: 319 10*3/uL (ref 150–400)
RBC: 4.76 MIL/uL (ref 4.22–5.81)
RDW: 13.3 % (ref 11.5–15.5)
WBC: 10.7 10*3/uL — ABNORMAL HIGH (ref 4.0–10.5)
nRBC: 0 % (ref 0.0–0.2)

## 2021-02-22 LAB — BASIC METABOLIC PANEL
Anion gap: 12 (ref 5–15)
Anion gap: 8 (ref 5–15)
BUN: 13 mg/dL (ref 6–20)
BUN: 11 mg/dL (ref 6–20)
CO2: 30 mmol/L (ref 22–32)
CO2: 33 mmol/L — ABNORMAL HIGH (ref 22–32)
Calcium: 8.8 mg/dL — ABNORMAL LOW (ref 8.9–10.3)
Calcium: 8.5 mg/dL — ABNORMAL LOW (ref 8.9–10.3)
Chloride: 97 mmol/L — ABNORMAL LOW (ref 98–111)
Chloride: 96 mmol/L — ABNORMAL LOW (ref 98–111)
Creatinine, Ser: 1.07 mg/dL (ref 0.61–1.24)
Creatinine, Ser: 0.9 mg/dL (ref 0.61–1.24)
GFR, Estimated: >60 mL/min (ref >60)
GFR, Estimated: >60 mL/min (ref >60)
Glucose, Bld: 138 mg/dL — ABNORMAL HIGH (ref 70–99)
Glucose, Bld: 134 mg/dL — ABNORMAL HIGH (ref 70–99)
Potassium: 2.6 mmol/L — CL (ref 3.5–5.1)
Potassium: 2.6 mmol/L — CL (ref 3.5–5.1)
Sodium: 139 mmol/L (ref 135–145)
Sodium: 137 mmol/L (ref 135–145)

## 2021-02-22 LAB — RESP PANEL BY RT-PCR (FLU A&B, COVID) ARPGX2
Influenza A by PCR: NEGATIVE
Influenza B by PCR: NEGATIVE
SARS Coronavirus 2 by RT PCR: NEGATIVE

## 2021-02-22 LAB — MAGNESIUM: Magnesium: 1.5 mg/dL — ABNORMAL LOW (ref 1.7–2.4)

## 2021-02-22 MED ORDER — SODIUM CHLORIDE 0.9 % IV SOLN: INTRAVENOUS | Status: DC

## 2021-02-22 MED ORDER — MAGNESIUM SULFATE 2 GM/50ML IV SOLN
2.0000 g | Freq: Once | INTRAVENOUS | Status: AC
  Administered 2021-02-22: 2 g via INTRAVENOUS
  Filled 2021-02-22: qty 50

## 2021-02-22 MED ORDER — POTASSIUM CHLORIDE 10 MEQ/100ML IV SOLN
10.0000 meq | INTRAVENOUS | Status: AC
  Administered 2021-02-22 (×2): 10 meq via INTRAVENOUS

## 2021-02-22 MED ORDER — METOPROLOL SUCCINATE ER 50 MG PO TB24
50.0000 mg | ORAL_TABLET | Freq: Every day | ORAL | Status: DC
  Administered 2021-02-23: 09:00:00 50 mg via ORAL
  Filled 2021-02-22: qty 1

## 2021-02-22 MED ORDER — ENOXAPARIN SODIUM 80 MG/0.8ML IJ SOSY
80.0000 mg | PREFILLED_SYRINGE | INTRAMUSCULAR | Status: DC
  Administered 2021-02-22: 80 mg via SUBCUTANEOUS
  Filled 2021-02-22: qty 0.8

## 2021-02-22 MED ORDER — SODIUM CHLORIDE 0.9 % IV BOLUS
1000.0000 mL | Freq: Once | INTRAVENOUS | Status: AC
Start: 2021-02-22 — End: 2021-02-22
  Administered 2021-02-22: 1000 mL via INTRAVENOUS

## 2021-02-22 MED ORDER — MAGNESIUM OXIDE -MG SUPPLEMENT 400 (240 MG) MG PO TABS
400.0000 mg | ORAL_TABLET | Freq: Every day | ORAL | Status: DC
  Administered 2021-02-23: 09:00:00 400 mg via ORAL
  Filled 2021-02-22 (×3): qty 1

## 2021-02-22 MED ORDER — POTASSIUM CHLORIDE CRYS ER 20 MEQ PO TBCR
40.0000 meq | EXTENDED_RELEASE_TABLET | ORAL | Status: AC
  Administered 2021-02-22 – 2021-02-23 (×2): 40 meq via ORAL
  Filled 2021-02-22 (×2): qty 2

## 2021-02-22 MED ORDER — POTASSIUM CHLORIDE 10 MEQ/100ML IV SOLN
10.0000 meq | INTRAVENOUS | Status: AC
Start: 2021-02-22 — End: 2021-02-22
  Administered 2021-02-22 (×4): 10 meq via INTRAVENOUS
  Filled 2021-02-22 (×4): qty 100

## 2021-02-22 MED ORDER — MAGNESIUM OXIDE 400 MG PO TABS: 400.0000 mg | ORAL_TABLET | Freq: Every day | ORAL | Status: DC

## 2021-02-22 MED ORDER — METOPROLOL TARTRATE 5 MG/5ML IV SOLN
5.0000 mg | Freq: Once | INTRAVENOUS | Status: AC
  Administered 2021-02-22: 5 mg via INTRAVENOUS
  Filled 2021-02-22: qty 5

## 2021-02-22 MED ORDER — ADENOSINE 6 MG/2ML IV SOLN
6.0000 mg | Freq: Once | INTRAVENOUS | Status: AC
  Administered 2021-02-22: 6 mg via INTRAVENOUS

## 2021-02-22 MED ORDER — POTASSIUM CHLORIDE CRYS ER 20 MEQ PO TBCR
40.0000 meq | EXTENDED_RELEASE_TABLET | Freq: Once | ORAL | Status: AC
  Administered 2021-02-22: 40 meq via ORAL
  Filled 2021-02-22: qty 2

## 2021-02-22 MED ORDER — POTASSIUM CHLORIDE 10 MEQ/100ML IV SOLN
10.0000 meq | INTRAVENOUS | Status: AC
  Administered 2021-02-22 – 2021-02-23 (×4): 10 meq via INTRAVENOUS
  Filled 2021-02-22 (×5): qty 100

## 2021-02-22 NOTE — ED Triage Notes (Signed)
Pt woke up with SOB and generalized weakness.  Checked his pulse ox at home and RA sats 96% but HR of 206 at home. Pt took a sip of pepsi in waiting room when called for triage, unable to do oral temp.

## 2021-02-22 NOTE — H&P (Signed)
TRH H&P   Patient Demographics:    Anthony Huff, is a 40 y.o. male  MRN: [id number]   DOB - 1980/09/30  Admit Date - 02/22/2021  Outpatient Primary MD for the patient is Pcp, No  Referring MD/NP/PA: Dr Rubin Payor  Outpatient Specialists: cardiology Dr Diona Browner    Patient coming from: home  Chief Complaint  Patient presents with   Tachycardia    SVT       HPI:    Anthony Huff  is a 40 y.o. 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, followed by cardiology, he is on Toprol-XL, patient presents today secondary to complaints of palpitation, patient reports he recovered from flu 3 weeks ago, has not been eating or drinking much, he did feel some palpitation and heart racing today, remind him with his previous SVT, reports he is compliant with his Toprol-XL, which he took today, reports he is taking his magnesium as well, but reports he stopped taking his potassium supplement as is difficult for him to swallow the pill due to its size, he does report some chest tightness during the events, but otherwise denies any fever, chills, no dyspnea, he denies any drug use, reports he quit smoking last month, he denies drinking any highly caffeinated energy drinks (only drinks 1 can of Pepsi per day). -In ED patient was noted to have SVT, he received 6 mg of IV adenosine patient with resolution of SVT, he had another events after that we already required another IV adenosine push, work-up significant for low potassium at 2.6 and low magnesium at 1.5, [facility] hospitalist consulted to admit.    Review of systems:    In addition to the HPI above,  No Fever-chills, No Headache, No changes with Vision or hearing, No problems swallowing food or Liquids, No Chest pain, Cough , he reports palpitation. No Abdominal pain, No Nausea or Vommitting, Bowel movements are regular, No Blood in  stool or Urine, No dysuria, No new skin rashes or bruises, No new joints pains-aches,  No new weakness, tingling, numbness in any extremity, No recent weight gain or loss, No polyuria, polydypsia or polyphagia, No significant Mental Stressors.  A full 10 point Review of Systems was done, except as stated above, all other Review of Systems were negative.   With Past History of the following :    Past Medical History:  Diagnosis Date   COVID-17 April 2020   SVT (supraventricular tachycardia) (HCC)       Past Surgical History:  Procedure Laterality Date   WISDOM TOOTH EXTRACTION        Social History:     Social History   Tobacco Use   Smoking status: Former    Types: Cigarettes    Start date: 03/31/2005    Quit date: 03/31/2019    Years since quitting: 1.9   Smokeless tobacco: Never  Substance Use Topics   Alcohol use: No        Family History :     Family History  Problem Relation Age of Onset   Heart failure Mother    Diabetes Mother    Cancer Father    Heart failure Father     Home Medications:   Prior to Admission medications   Medication Sig Start Date End Date Taking? Authorizing Provider  magnesium oxide (MAG-OX) 400 MG tablet Take 400 mg by mouth daily.   Yes [provider]  metoprolol succinate (TOPROL-XL) 50 MG 24 hr tablet Take 1 tablet (50 mg total) by mouth daily. Take with or immediately following a meal. 10/08/20 02/22/21 Yes Satira Sark, MD  promethazine-dextromethorphan (PROMETHAZINE-DM) 6.25-15 MG/5ML syrup Take 5 mLs by mouth 4 (four) times daily as needed for cough. Patient not taking: Reported on 02/22/2021 02/06/21   Vanessa Kick, MD     Allergies:     Allergies  Allergen Reactions   Penicillins Shortness Of Breath     Physical Exam:   Vitals  Blood pressure (!) 147/92, pulse 97, temperature (!) 97.2 F (36.2 C), temperature source Axillary, resp. rate 20, height 6' (1.829 m), weight (!) 154.2 kg,  SpO2 97 %.   1. General well developed male lying in bed in NAD,    2. Normal affect and insight, Not Suicidal or Homicidal, Awake Alert, Oriented X 3.  3. No F.N deficits, ALL C.Nerves Intact, Strength 5/5 all 4 extremities, Sensation intact all 4 extremities, Plantars down going.  4. Ears and Eyes appear Normal, Conjunctivae clear, PERRLA. Moist Oral Mucosa.  5. Supple Neck, No JVD, No cervical lymphadenopathy appriciated, No Carotid Bruits.  6. Symmetrical Chest wall movement, Good air movement bilaterally, CTAB.  7. RRR, No Gallops, Rubs or Murmurs, No Parasternal Heave.  8. Positive Bowel Sounds, Abdomen Soft, No tenderness, No organomegaly appriciated,No rebound -guarding or rigidity.  9.  No Cyanosis, Normal Skin Turgor, No Skin Rash or Bruise.  10. Good muscle tone,  joints appear normal , no effusions, Normal ROM.  11. No Palpable Lymph Nodes in Neck or Axillae     Data Review:    CBC Recent Labs  Lab 02/22/21 1147  WBC 10.7*  HGB 14.4  HCT 42.3  PLT 319  MCV 88.9  MCH 30.3  MCHC 34.0  RDW 13.3   ------------------------------------------------------------------------------------------------------------------  Chemistries  Recent Labs  Lab 02/22/21 1147  NA 139  K 2.6*  CL 97*  CO2 30  GLUCOSE 138*  BUN 13  CREATININE 1.07  CALCIUM 8.8*  MG 1.5*   ------------------------------------------------------------------------------------------------------------------ estimated creatinine clearance is 139 mL/min (by C-G formula based on SCr of 1.07 mg/dL). ------------------------------------------------------------------------------------------------------------------ No results for input(s): TSH, T4TOTAL, T3FREE, THYROIDAB in the last 72 hours.  Invalid input(s): FREET3  Coagulation profile No results for input(s): INR, PROTIME in the last 168  hours. ------------------------------------------------------------------------------------------------------------------- No results for input(s): DDIMER in the last 72 hours. -------------------------------------------------------------------------------------------------------------------  Cardiac Enzymes No results for input(s): CKMB, TROPONINI, MYOGLOBIN in the last 168 hours.  Invalid input(s): CK ------------------------------------------------------------------------------------------------------------------ No results found for: BNP   ---------------------------------------------------------------------------------------------------------------  Urinalysis No results found for: COLORURINE, APPEARANCEUR, LABSPEC, [place], GLUCOSEU, HGBUR, BILIRUBINUR, KETONESUR, PROTEINUR, UROBILINOGEN, NITRITE, LEUKOCYTESUR  ----------------------------------------------------------------------------------------------------------------   Imaging Results:    No results found.  My personal review of EKG: Patient in SVT rhythm with heart rate around 180 (resolved , heart rate 98 at normal sinus rhythm on telemetry)   Assessment & Plan:  Principal Problem:   SVT (supraventricular tachycardia) (HCC)   SVT -Discussed with cardiology, patient will be observed overnight in stepdown while his potassium has been repleted. -With known history of SVT followed by cardiology in the past, his SVT is known to be secondary to hypokalemia. -He had 2 runs of SVT while in ED, he did require adenosine x2, he is currently in normal sinus rhythm -Continue to monitor in stepdown if further need arises of more adenosine -Patient denies any history of heavy caffeine consumption, he only drinks 1 Pepsi per day. -Replete electrolyte aggressively and monitor closely -Continue with home dose Toprol-XL  History of tobacco abuse -Patient reports he did quit last month, he was congratulated about  that  Hypokalemia -Patient with severe hypokalemia, currently being repleted, will recheck BMP this evening and replete further if needed, continue to monitor on telemetry -Patient reports he was not taking his outpatient potassium pills due to their size, I have instructed him that he can crush these pills in applesauce if he needs to. -Need to be discharged on potassium supplement  Hypomagnesemia - Repleted  DVT Prophylaxis  Lovenox   AM Labs Ordered, also please review Full Orders  Family Communication: Admission, patients condition and plan of care including tests being ordered have been discussed with the patient  who indicate understanding and agree with the plan and Code Status.  Code Status fULL  Likely DC to  HOME  Condition GUARDED     Consults called: Discussed With cardiology Dr. Oval Linsey by phone  Admission status: observation  Time spent in minutes : 60 minutes   Phillips Climes M.D on 02/22/2021 at 3:34 PM   [facility] - Office  [phone]

## 2021-02-22 NOTE — ED Triage Notes (Signed)
Pt sick recently with the flu and admits to some days not taking medication.  Pt states he did take it today.

## 2021-02-22 NOTE — ED Provider Notes (Signed)
[facility] EMERGENCY DEPARTMENT Provider Note   CSN: [id number] Arrival date & time: 02/22/21  1122     History Chief Complaint  Patient presents with   Tachycardia    SVT     Anthony Huff is a 40 y.o. male.  HPI Patient with SVT.  History of same.  Began to feel today.  Has had the flu 3-week ago states he had not been eating and drinking much.  However he began racing today however.  Is on metoprolol states he took that today.  No fevers or chills.  No coughing.  States he is feeling better for the flu but not all the way back to normal.  No swelling in his legs.  Mild chest tightness with the event.    Past Medical History:  Diagnosis Date   COVID-17 April 2020   SVT (supraventricular tachycardia) (HCC)     There are no problems to display for this patient.   Past Surgical History:  Procedure Laterality Date   WISDOM TOOTH EXTRACTION         Family History  Problem Relation Age of Onset   Heart failure Mother    Diabetes Mother    Cancer Father    Heart failure Father     Social History   Tobacco Use   Smoking status: Former    Types: Cigarettes    Start date: 03/31/2005    Quit date: 03/31/2019    Years since quitting: 1.9   Smokeless tobacco: Never  Vaping Use   Vaping Use: Never used  Substance Use Topics   Alcohol use: No   Drug use: Not Currently    Types: Marijuana    Home Medications Prior to Admission medications   Medication Sig Start Date End Date Taking? Authorizing Provider  magnesium oxide (MAG-OX) 400 MG tablet Take 400 mg by mouth daily.   Yes [provider]  metoprolol succinate (TOPROL-XL) 50 MG 24 hr tablet Take 1 tablet (50 mg total) by mouth daily. Take with or immediately following a meal. 10/08/20 02/22/21 Yes Jonelle Sidle, MD  promethazine-dextromethorphan (PROMETHAZINE-DM) 6.25-15 MG/5ML syrup Take 5 mLs by mouth 4 (four) times daily as needed for cough. Patient not taking: Reported on 02/22/2021  02/06/21   Mardella Layman, MD    Allergies    Penicillins  Review of Systems   Review of Systems  Constitutional:  Positive for appetite change and fatigue.  HENT:  Negative for congestion.   Cardiovascular:  Positive for palpitations.  Gastrointestinal:  Negative for abdominal pain.  Genitourinary:  Negative for flank pain.  Musculoskeletal:  Negative for back pain.  Skin:  Negative for rash.  Neurological:  Negative for weakness.  Psychiatric/Behavioral:  Negative for confusion.    Physical Exam Updated Vital Signs BP 95/79   Pulse 94   Temp (!) 97.2 F (36.2 C) (Axillary)   Resp 18   Ht 6' (1.829 m)   Wt (!) 154.2 kg   SpO2 96%   BMI 46.11 kg/m   Physical Exam Vitals and nursing note reviewed.  HENT:     Head: Atraumatic.  Eyes:     Pupils: Pupils are equal, round, and reactive to light.  Cardiovascular:     Rate and Rhythm: Tachycardia present.  Pulmonary:     Breath sounds: No wheezing.  Abdominal:     Tenderness: There is no abdominal tenderness.  Musculoskeletal:        General: No tenderness.  Cervical back: Neck supple.  Skin:    General: Skin is warm.     Capillary Refill: Capillary refill takes less than 2 seconds.  Neurological:     Mental Status: He is alert and oriented to person, place, and time.    ED Results / Procedures / Treatments   Labs (all labs ordered are listed, but only abnormal results are displayed) Labs Reviewed  BASIC METABOLIC PANEL - Abnormal; Notable for the following components:      Result Value   Potassium 2.6 (*)    Chloride 97 (*)    Glucose, Bld 138 (*)    Calcium 8.8 (*)    All other components within normal limits  MAGNESIUM - Abnormal; Notable for the following components:   Magnesium 1.5 (*)    All other components within normal limits  CBC - Abnormal; Notable for the following components:   WBC 10.7 (*)    All other components within normal limits  RESP PANEL BY RT-PCR (FLU A&B, COVID) [id number]     EKG EKG Interpretation  Date/Time:  Saturday February 22 2021 11:36:02 EST Ventricular Rate:  205 PR Interval:    QRS Duration: 86 QT Interval:  240 QTC Calculation: 443 R Axis:   145 Text Interpretation:  Critical Test Result: Arrhythmia Supraventricular tachycardia with occasional Premature ventricular complexes Right axis deviation Marked ST abnormality, possible inferior subendocardial injury Abnormal ECG Previous EKG also had SVT. Confirmed by Benjiman Core [phone]) on 02/22/2021 11:39:53 AM  Radiology No results found.  Procedures Procedures   Medications Ordered in ED Medications  potassium chloride 10 mEq in 100 mL IVPB (10 mEq Intravenous New Bag/Given 02/22/21 1511)  metoprolol tartrate (LOPRESSOR) injection 5 mg (has no administration in time range)  adenosine (ADENOCARD) 6 MG/2ML injection 6 mg (6 mg Intravenous Given 02/22/21 1154)  sodium chloride 0.9 % bolus 1,000 mL (0 mLs Intravenous Stopped 02/22/21 1332)  magnesium sulfate IVPB 2 g 50 mL (0 g Intravenous Stopped 02/22/21 1406)  adenosine (ADENOCARD) 6 MG/2ML injection 6 mg (6 mg Intravenous Given 02/22/21 1506)    ED Course  I have reviewed the triage vital signs and the nursing notes.  Pertinent labs & imaging results that were available during my care of the patient were reviewed by me and considered in my medical decision making (see chart for details).    MDM Rules/Calculators/A&P                           Patient presents with SVT.  Began today.  History of same.  Thinks it is because he had dehydration from recent flu infection.  States he had not been eating and drinking much.  Found to have recurrent hypomagnesemia and hypokalemia.  This was being supplemented.  Had been given adenosine 6 mg with good conversion back to sinus rhythm.  However had another episode went back into an SVT.  Another adenosine 6 mg converted him but with recurrent episode of feels the patient would benefit from  mission to the hospital.  Metoprolol given after to help suppress the SVT.  Already on oral metoprolol.  Will admit to hospitalist.  CRITICAL CARE Performed by: Benjiman Core Total critical care time: 30 minutes Critical care time was exclusive of separately billable procedures and treating other patients. Critical care was necessary to treat or prevent imminent or life-threatening deterioration. Critical care was time spent personally by me on the following activities: development of treatment plan with  patient and/or surrogate as well as nursing, discussions with consultants, evaluation of patient's response to treatment, examination of patient, obtaining history from patient or surrogate, ordering and performing treatments and interventions, ordering and review of laboratory studies, ordering and review of radiographic studies, pulse oximetry and re-evaluation of patient's condition.  Final Clinical Impression(s) / ED Diagnoses Final diagnoses:  SVT (supraventricular tachycardia) (HCC)  Hypokalemia  Hypomagnesemia    Rx / DC Orders ED Discharge Orders     None        Benjiman Core, MD 02/22/21 1520

## 2021-02-22 NOTE — ED Notes (Signed)
Pt placed on Zoll monitor. Dr Rubin Payor at bedside.

## 2021-02-23 DIAGNOSIS — E876 Hypokalemia: Secondary | ICD-10-CM

## 2021-02-23 DIAGNOSIS — Z9114 Patient's other noncompliance with medication regimen: Secondary | ICD-10-CM | POA: Diagnosis not present

## 2021-02-23 DIAGNOSIS — I471 Supraventricular tachycardia: Secondary | ICD-10-CM

## 2021-02-23 LAB — CBC
HCT: 36.5 % — ABNORMAL LOW (ref 39.0–52.0)
Hemoglobin: 12.2 g/dL — ABNORMAL LOW (ref 13.0–17.0)
MCH: 30 pg (ref 26.0–34.0)
MCHC: 33.4 g/dL (ref 30.0–36.0)
MCV: 89.7 fL (ref 80.0–100.0)
Platelets: 271 10*3/uL (ref 150–400)
RBC: 4.07 MIL/uL — ABNORMAL LOW (ref 4.22–5.81)
RDW: 13.4 % (ref 11.5–15.5)
WBC: 7.9 10*3/uL (ref 4.0–10.5)
nRBC: 0 % (ref 0.0–0.2)

## 2021-02-23 LAB — BASIC METABOLIC PANEL
Anion gap: 8 (ref 5–15)
BUN: 10 mg/dL (ref 6–20)
CO2: 30 mmol/L (ref 22–32)
Calcium: 8.3 mg/dL — ABNORMAL LOW (ref 8.9–10.3)
Chloride: 101 mmol/L (ref 98–111)
Creatinine, Ser: 0.75 mg/dL (ref 0.61–1.24)
GFR, Estimated: >60 mL/min (ref >60)
Glucose, Bld: 128 mg/dL — ABNORMAL HIGH (ref 70–99)
Potassium: 2.7 mmol/L — CL (ref 3.5–5.1)
Sodium: 139 mmol/L (ref 135–145)

## 2021-02-23 LAB — MRSA NEXT GEN BY PCR, NASAL: MRSA by PCR Next Gen: NOT DETECTED

## 2021-02-23 LAB — POTASSIUM: Potassium: 3.1 mmol/L — ABNORMAL LOW (ref 3.5–5.1)

## 2021-02-23 LAB — MAGNESIUM: Magnesium: 1.9 mg/dL (ref 1.7–2.4)

## 2021-02-23 LAB — NA AND K (SODIUM & POTASSIUM), RAND UR
Potassium Urine: 21 mmol/L
Sodium, Ur: 129 mmol/L

## 2021-02-23 MED ORDER — POTASSIUM CHLORIDE 10 MEQ/100ML IV SOLN
10.0000 meq | INTRAVENOUS | Status: AC
  Administered 2021-02-23 (×4): 10 meq via INTRAVENOUS
  Filled 2021-02-23 (×4): qty 100

## 2021-02-23 MED ORDER — CHLORHEXIDINE GLUCONATE CLOTH 2 % EX PADS
6.0000 | MEDICATED_PAD | Freq: Every day | CUTANEOUS | Status: DC
  Administered 2021-02-23: 09:00:00 6 via TOPICAL

## 2021-02-23 MED ORDER — POTASSIUM CHLORIDE CRYS ER 20 MEQ PO TBCR: 20.0000 meq | EXTENDED_RELEASE_TABLET | Freq: Two times a day (BID) | ORAL | 1 refills | Status: DC

## 2021-02-23 MED ORDER — HYDROCORTISONE SOD SUC (PF) 100 MG IJ SOLR
100.0000 mg | Freq: Once | INTRAMUSCULAR | Status: AC
  Administered 2021-02-23: 06:00:00 100 mg via INTRAVENOUS
  Filled 2021-02-23: qty 2

## 2021-02-23 NOTE — TOC Transition Note (Signed)
Transition of Care [facility]) - CM/SW Discharge Note   Patient Details  Name: Anthony Huff MRN: [id number] Date of Birth: 1981-01-06  [facility] CM/SW Contact:  Barry Brunner, LCSW Phone Number: 02/23/2021, 1:07 PM   Clinical Narrative:    CSW notified of patient's readiness for discharge and PCP needs. CSW LVM to patient informing patient of [facility] referral and provided [facility] number and Address. CSW referred patient to [facility]. TOC signing off.          Patient Goals and CMS Choice        Discharge Placement                       Discharge Plan and Services                                     Social Determinants of Health (SDOH) Interventions     Readmission Risk Interventions No flowsheet data found.

## 2021-02-23 NOTE — Progress Notes (Signed)
Discharge paperwork and instructions provided to patient and patient's significant other. All belongings returned and patient dressed himself. PIV's removed. Sites clean, dry, and intact. Pt ambulated to main entrance with RN.

## 2021-02-23 NOTE — Discharge Summary (Signed)
Physician Discharge Summary  Anthony Huff PIR:[id number] DOB: Aug 01, 1980 DOA: 02/22/2021  Cardiologist: Dr. Diona Browner  PCP: referred to [facility]   Admit date: 02/22/2021 Discharge date: 02/23/2021  Admitted From:  HOME  Disposition: HOME   Recommendations for Outpatient Follow-up:  Follow up with PCP in 2 weeks Please obtain BMP, Magnesium in 1-2 weeks to follow up potassium and magnesium  Please establish care with endocrinologist to be evaluated for possible hyperaldosteronism   Discharge Condition: STABLE   CODE STATUS: FULL  DIET: Heart Healthy recommended    Brief Hospitalization Summary: Please see all hospital notes, images, labs for full details of the hospitalization. ADMISSION HPI:  Anthony Huff  is a 40 y.o. 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, followed by cardiology, he is on Toprol-XL, patient presents today secondary to complaints of palpitation, patient reports he recovered from flu 3 weeks ago, has not been eating or drinking much, he did feel some palpitation and heart racing today, remind him with his previous SVT, reports he is compliant with his Toprol-XL, which he took today, reports he is taking his magnesium as well, but reports he stopped taking his potassium supplement as is difficult for him to swallow the pill due to its size, he does report some chest tightness during the events, but otherwise denies any fever, chills, no dyspnea, he denies any drug use, reports he quit smoking last month, he denies drinking any highly caffeinated energy drinks (only drinks 1 can of Pepsi per day).  In ED patient was noted to have SVT, he received 6 mg of IV adenosine patient with resolution of SVT, he had another events after that we already required another IV adenosine push, work-up significant for low potassium at 2.6 and low magnesium at 1.5, [facility] hospitalist consulted to admit.    HOSPITAL COURSE BY PROBLEM LIST   SVT -Discussed with cardiology,  patient was observed overnight in stepdown while his potassium has been repleted. -With known history of SVT followed by cardiology in the past, his SVT is known to be secondary to hypokalemia. -For some reason, patient had stopped taking his potassium supplement -He was counseled on the importance of taking his potassium and verbalized understanding.  -He was total body depleted of potassium requiring high doses of potassium replacement in the hospital.  -He had 2 runs of SVT while in ED, he did require adenosine x2, he is currently in normal sinus rhythm -Patient denies any history of heavy caffeine consumption, he only drinks 1 Pepsi per day. -Continue with home dose Toprol-XL   History of tobacco use  -Patient reports he did quit last month.    Hypokalemia - severe -He was total body depleted of potassium, requiring high amounts of potassium supplementation.  -He is being referred to endocrinology to be evaluated for possible hyperaldosteronism given low K levels and high blood pressure -Patient with severe hypokalemia, currently being repleted, DC home on Kdur 20 meq BID    Hypomagnesemia - Repleted   DVT Prophylaxis  Lovenox   Discharge Diagnoses:  Principal Problem:   SVT (supraventricular tachycardia) (HCC) Active Problems:   Hypokalemia   Hypomagnesemia   Noncompliance w/medication treatment due to intermit use of medication   Discharge Instructions: Discharge Instructions     Ambulatory referral to Endocrinology   Complete by: As directed    Hospital follow up evaluate for hyperaldosteronism      Allergies as of 02/23/2021       Reactions  Penicillins Shortness Of Breath        Medication List     STOP taking these medications    promethazine-dextromethorphan 6.25-15 MG/5ML syrup Commonly known as: PROMETHAZINE-DM       TAKE these medications    magnesium oxide 400 MG tablet Commonly known as: MAG-OX Take 400 mg by mouth daily.   metoprolol  succinate 50 MG 24 hr tablet Commonly known as: TOPROL-XL Take 1 tablet (50 mg total) by mouth daily. Take with or immediately following a meal.   potassium chloride SA 20 MEQ tablet Commonly known as: KLOR-CON Take 1 tablet (20 mEq total) by mouth 2 (two) times daily.        Follow-up Information     Jonelle Sidle, MD. Schedule an appointment as soon as possible for a visit in 2 week(s).   Specialty: Cardiology Why: Hospital Follow Up Contact information: [address] [place] [address] [phone]                Allergies  Allergen Reactions   Penicillins Shortness Of Breath   Allergies as of 02/23/2021       Reactions   Penicillins Shortness Of Breath        Medication List     STOP taking these medications    promethazine-dextromethorphan 6.25-15 MG/5ML syrup Commonly known as: PROMETHAZINE-DM       TAKE these medications    magnesium oxide 400 MG tablet Commonly known as: MAG-OX Take 400 mg by mouth daily.   metoprolol succinate 50 MG 24 hr tablet Commonly known as: TOPROL-XL Take 1 tablet (50 mg total) by mouth daily. Take with or immediately following a meal.   potassium chloride SA 20 MEQ tablet Commonly known as: KLOR-CON Take 1 tablet (20 mEq total) by mouth 2 (two) times daily.        Procedures/Studies: DG Chest Portable 1 View  Result Date: 02/22/2021 CLINICAL DATA:  Shortness of breath, chest pain EXAM: PORTABLE CHEST 1 VIEW COMPARISON:  Chest x-ray 08/04/2020 FINDINGS: Slightly more prominent cardiac silhouette likely due to AP portable technique. Otherwise the heart and mediastinal contours are unchanged. No focal consolidation. Slightly more prominent interstitial markings. No pleural effusion. No pneumothorax. No acute osseous abnormality. IMPRESSION: Mild pulmonary edema. Electronically Signed   By: Tish Frederickson M.D.   On: 02/22/2021 15:46     Subjective: Pt reports that he feels better, no palpitations,  no chest pain, no SOB.    Discharge Exam: Vitals:   02/23/21 1000 02/23/21 1005  BP:  (!) 145/83  Pulse: 83 92  Resp: (!) 24 17  Temp:    SpO2: 96% 95%   Vitals:   02/23/21 0844 02/23/21 0900 02/23/21 1000 02/23/21 1005  BP: 128/90 (!) 151/79  (!) 145/83  Pulse: 90 86 83 92  Resp:  16 (!) 24 17  Temp:      TempSrc:      SpO2:  97% 96% 95%  Weight:      Height:       General: Pt is alert, awake, not in acute distress Cardiovascular: normal S1/S2 +, no rubs, no gallops Respiratory: CTA bilaterally, no wheezing, no rhonchi Abdominal: Soft, NT, ND, bowel sounds + Extremities: no edema, no cyanosis   The results of significant diagnostics from this hospitalization (including imaging, microbiology, ancillary and laboratory) are listed below for reference.     Microbiology: Recent Results (from the past 240 hour(s))  Resp Panel by RT-PCR (Flu A&B, Covid) Nasopharyngeal  Swab     Status: None   Collection Time: 02/22/21  2:59 PM   Specimen: Nasopharyngeal Swab; Nasopharyngeal(NP) swabs in vial transport medium  Result Value Ref Range Status   SARS Coronavirus 2 by RT PCR NEGATIVE NEGATIVE Final    Comment: (NOTE) SARS-CoV-2 target nucleic acids are NOT DETECTED.  The SARS-CoV-2 RNA is generally detectable in upper respiratory specimens during the acute phase of infection. The lowest concentration of SARS-CoV-2 viral copies this assay can detect is 138 copies/mL. A negative result does not preclude SARS-Cov-2 infection and should not be used as the sole basis for treatment or other patient management decisions. A negative result may occur with  improper specimen collection/handling, submission of specimen other than nasopharyngeal swab, presence of viral mutation(s) within the areas targeted by this assay, and inadequate number of viral copies(<138 copies/mL). A negative result must be combined with clinical observations, patient history, and epidemiological information. The  expected result is Negative.  Fact Sheet for Patients:  BloggerCourse.com  Fact Sheet for Healthcare Providers:  SeriousBroker.it  This test is no t yet approved or cleared by the [place] FDA and  has been authorized for detection and/or diagnosis of SARS-CoV-2 by FDA under an Emergency Use Authorization (EUA). This EUA will remain  in effect (meaning this test can be used) for the duration of the COVID-19 declaration under Section 564(b)(1) of the Act, 21 U.S.C.section 360bbb-3(b)(1), unless the authorization is terminated  or revoked sooner.       Influenza A by PCR NEGATIVE NEGATIVE Final   Influenza B by PCR NEGATIVE NEGATIVE Final    Comment: (NOTE) The Xpert Xpress SARS-CoV-2/FLU/RSV plus assay is intended as an aid in the diagnosis of influenza from Nasopharyngeal swab specimens and should not be used as a sole basis for treatment. Nasal washings and aspirates are unacceptable for Xpert Xpress SARS-CoV-2/FLU/RSV testing.  Fact Sheet for Patients: BloggerCourse.com  Fact Sheet for Healthcare Providers: SeriousBroker.it  This test is not yet approved or cleared by the [place] FDA and has been authorized for detection and/or diagnosis of SARS-CoV-2 by FDA under an Emergency Use Authorization (EUA). This EUA will remain in effect (meaning this test can be used) for the duration of the COVID-19 declaration under Section 564(b)(1) of the Act, 21 U.S.C. section 360bbb-3(b)(1), unless the authorization is terminated or revoked.  Performed at [facility], [address], [place], [place] [address]   MRSA Next Gen by PCR, Nasal     Status: None   Collection Time: 02/22/21  4:45 PM   Specimen: Nasal Mucosa; Nasal Swab  Result Value Ref Range Status   MRSA by PCR Next Gen NOT DETECTED NOT DETECTED Final    Comment: (NOTE) The GeneXpert MRSA Assay (FDA approved for  NASAL specimens only), is one component of a comprehensive MRSA colonization surveillance program. It is not intended to diagnose MRSA infection nor to guide or monitor treatment for MRSA infections. Test performance is not FDA approved in patients less than 80 years old. Performed at [facility], [address], [place], [place] [address]      Labs: BNP (last 3 results) No results for input(s): BNP in the last 8760 hours. Basic Metabolic Panel: Recent Labs  Lab 02/22/21 1147 02/22/21 2017 02/23/21 0307 02/23/21 0954  NA 139 137 139  --   K 2.6* 2.6* 2.7* 3.1*  CL 97* 96* 101  --   CO2 30 33* 30  --   GLUCOSE 138* 134* 128*  --  BUN 13 11 10   --   CREATININE 1.07 0.90 0.75  --   CALCIUM 8.8* 8.5* 8.3*  --   MG 1.5*  --  1.9  --    Liver Function Tests: No results for input(s): AST, ALT, ALKPHOS, BILITOT, PROT, ALBUMIN in the last 168 hours. No results for input(s): LIPASE, AMYLASE in the last 168 hours. No results for input(s): AMMONIA in the last 168 hours. CBC: Recent Labs  Lab 02/22/21 1147 02/23/21 0307  WBC 10.7* 7.9  HGB 14.4 12.2*  HCT 42.3 36.5*  MCV 88.9 89.7  PLT 319 271   Cardiac Enzymes: No results for input(s): CKTOTAL, CKMB, CKMBINDEX, TROPONINI in the last 168 hours. BNP: Invalid input(s): POCBNP CBG: No results for input(s): GLUCAP in the last 168 hours. D-Dimer No results for input(s): DDIMER in the last 72 hours. Hgb A1c No results for input(s): HGBA1C in the last 72 hours. Lipid Profile No results for input(s): CHOL, HDL, LDLCALC, TRIG, CHOLHDL, LDLDIRECT in the last 72 hours. Thyroid function studies No results for input(s): TSH, T4TOTAL, T3FREE, THYROIDAB in the last 72 hours.  Invalid input(s): FREET3 Anemia work up No results for input(s): VITAMINB12, FOLATE, FERRITIN, TIBC, IRON, RETICCTPCT in the last 72 hours. Urinalysis No results found for: COLORURINE, APPEARANCEUR, LABSPEC, PHURINE, GLUCOSEU, HGBUR, BILIRUBINUR, KETONESUR,  PROTEINUR, UROBILINOGEN, NITRITE, LEUKOCYTESUR Sepsis Labs Invalid input(s): PROCALCITONIN,  WBC,  LACTICIDVEN Microbiology Recent Results (from the past 240 hour(s))  Resp Panel by RT-PCR (Flu A&B, Covid) Nasopharyngeal Swab     Status: None   Collection Time: 02/22/21  2:59 PM   Specimen: Nasopharyngeal Swab; Nasopharyngeal(NP) swabs in vial transport medium  Result Value Ref Range Status   SARS Coronavirus 2 by RT PCR NEGATIVE NEGATIVE Final    Comment: (NOTE) SARS-CoV-2 target nucleic acids are NOT DETECTED.  The SARS-CoV-2 RNA is generally detectable in upper respiratory specimens during the acute phase of infection. The lowest concentration of SARS-CoV-2 viral copies this assay can detect is 138 copies/mL. A negative result does not preclude SARS-Cov-2 infection and should not be used as the sole basis for treatment or other patient management decisions. A negative result may occur with  improper specimen collection/handling, submission of specimen other than nasopharyngeal swab, presence of viral mutation(s) within the areas targeted by this assay, and inadequate number of viral copies(<138 copies/mL). A negative result must be combined with clinical observations, patient history, and epidemiological information. The expected result is Negative.  Fact Sheet for Patients:  BloggerCourse.com  Fact Sheet for Healthcare Providers:  SeriousBroker.it  This test is no t yet approved or cleared by the [place] FDA and  has been authorized for detection and/or diagnosis of SARS-CoV-2 by FDA under an Emergency Use Authorization (EUA). This EUA will remain  in effect (meaning this test can be used) for the duration of the COVID-19 declaration under Section 564(b)(1) of the Act, 21 U.S.C.section 360bbb-3(b)(1), unless the authorization is terminated  or revoked sooner.       Influenza A by PCR NEGATIVE NEGATIVE Final    Influenza B by PCR NEGATIVE NEGATIVE Final    Comment: (NOTE) The Xpert Xpress SARS-CoV-2/FLU/RSV plus assay is intended as an aid in the diagnosis of influenza from Nasopharyngeal swab specimens and should not be used as a sole basis for treatment. Nasal washings and aspirates are unacceptable for Xpert Xpress SARS-CoV-2/FLU/RSV testing.  Fact Sheet for Patients: BloggerCourse.com  Fact Sheet for Healthcare Providers: SeriousBroker.it  This test is not yet approved or cleared  by the [place] and has been authorized for detection and/or diagnosis of SARS-CoV-2 by FDA under an Emergency Use Authorization (EUA). This EUA will remain in effect (meaning this test can be used) for the duration of the COVID-19 declaration under Section 564(b)(1) of the Act, 21 U.S.C. section 360bbb-3(b)(1), unless the authorization is terminated or revoked.  Performed at [facility], [address], [place], [place] [address]   MRSA Next Gen by PCR, Nasal     Status: None   Collection Time: 02/22/21  4:45 PM   Specimen: Nasal Mucosa; Nasal Swab  Result Value Ref Range Status   MRSA by PCR Next Gen NOT DETECTED NOT DETECTED Final    Comment: (NOTE) The GeneXpert MRSA Assay (FDA approved for NASAL specimens only), is one component of a comprehensive MRSA colonization surveillance program. It is not intended to diagnose MRSA infection nor to guide or monitor treatment for MRSA infections. Test performance is not FDA approved in patients less than 46 years old. Performed at [facility], [address], [place], [place] [address]    Time coordinating discharge:   SIGNED:  Standley Dakins, MD  [facility] 02/23/2021, 11:05 AM How to contact the [facility] Attending or Consulting provider 7A - 7P or covering provider during after hours 7P -7A, for this patient?  Check the care team in [facility] and look for a) attending/consulting [facility] provider  listed and b) the [facility] team listed Log into [email or web address] and use [facility] universal password to access. If you do not have the password, please contact the hospital operator. Locate the [facility] provider you are looking for under [facility] and page to a number that you can be directly reached. If you still have difficulty reaching the provider, please page the [facility] (Director on Call) for the Hospitalists listed on amion for assistance.

## 2021-02-23 NOTE — Discharge Instructions (Signed)
PLEASE HAVE YOUR POTASSIUM CHECKED AGAIN IN 1-2 WEEKS   IMPORTANT INFORMATION: PAY CLOSE ATTENTION   PHYSICIAN DISCHARGE INSTRUCTIONS  Follow with Primary care provider, cardiologist and other consultants as instructed by your Hospitalist Physician  SEEK MEDICAL CARE OR RETURN TO EMERGENCY ROOM IF SYMPTOMS COME BACK, WORSEN OR NEW PROBLEM DEVELOPS   Please note: You were cared for by a hospitalist during your hospital stay. Every effort will be made to forward records to your primary care provider.  You can request that your primary care provider send for your hospital records if they have not received them.  Once you are discharged, your primary care physician will handle any further medical issues. Please note that NO REFILLS for any discharge medications will be authorized once you are discharged, as it is imperative that you return to your primary care physician (or establish a relationship with a primary care physician if you do not have one) for your post hospital discharge needs so that they can reassess your need for medications and monitor your lab values.  Please get a complete blood count and chemistry panel checked by your Primary MD at your next visit, and again as instructed by your Primary MD.  Get Medicines reviewed and adjusted: Please take all your medications with you for your next visit with your Primary MD  Laboratory/radiological data: Please request your Primary MD to go over all hospital tests and procedure/radiological results at the follow up, please ask your primary care provider to get all Hospital records sent to his/her office.  In some cases, they will be blood work, cultures and biopsy results pending at the time of your discharge. Please request that your primary care provider follow up on these results.  If you are diabetic, please bring your blood sugar readings with you to your follow up appointment with primary care.    Please call and make your follow up  appointments as soon as possible.    Also Note the following: If you experience worsening of your admission symptoms, develop shortness of breath, life threatening emergency, suicidal or homicidal thoughts you must seek medical attention immediately by calling 911 or calling your MD immediately  if symptoms less severe.  You must read complete instructions/literature along with all the possible adverse reactions/side effects for all the Medicines you take and that have been prescribed to you. Take any new Medicines after you have completely understood and accpet all the possible adverse reactions/side effects.   Do not drive when taking Pain medications or sleeping medications (Benzodiazepines)  Do not take more than prescribed Pain, Sleep and Anxiety Medications. It is not advisable to combine anxiety,sleep and pain medications without talking with your primary care practitioner  Special Instructions: If you have smoked or chewed Tobacco  in the last 2 yrs please stop smoking, stop any regular Alcohol  and or any Recreational drug use.  Wear Seat belts while driving.  Do not drive if taking any narcotic, mind altering or controlled substances or recreational drugs or alcohol.

## 2021-02-24 LAB — HIV ANTIBODY (ROUTINE TESTING W REFLEX): HIV Screen 4th Generation wRfx: NONREACTIVE

## 2021-03-07 NOTE — TOC Progression Note (Addendum)
12/9:   TOC contacted by [facility] that family has reached out because Endocrinology office did not receive referral. CSW attempted to call [facility] to speak about if they got referral. This CSW sat on hold for 20 minutes awaiting to speak with someone in the office. There was no answer. CSW faxed ambulatory referral order to office.

## 2021-03-13 ENCOUNTER — Other Ambulatory Visit: Payer: Self-pay

## 2021-03-13 ENCOUNTER — Emergency Department (HOSPITAL_COMMUNITY)
Admission: EM | Admit: 2021-03-13 | Discharge: 2021-03-13 | Disposition: A | Discharge status: Home / Self Care | Payer: Medicaid Other | Attending: Emergency Medicine | Admitting: Emergency Medicine

## 2021-03-13 DIAGNOSIS — E876 Hypokalemia: Secondary | ICD-10-CM | POA: Diagnosis not present

## 2021-03-13 DIAGNOSIS — Z79899 Other long term (current) drug therapy: Secondary | ICD-10-CM | POA: Insufficient documentation

## 2021-03-13 DIAGNOSIS — Z87891 Personal history of nicotine dependence: Secondary | ICD-10-CM | POA: Diagnosis not present

## 2021-03-13 DIAGNOSIS — I471 Supraventricular tachycardia: Secondary | ICD-10-CM | POA: Insufficient documentation

## 2021-03-13 DIAGNOSIS — Z8616 Personal history of COVID-19: Secondary | ICD-10-CM | POA: Insufficient documentation

## 2021-03-13 DIAGNOSIS — R002 Palpitations: Secondary | ICD-10-CM | POA: Diagnosis present

## 2021-03-13 LAB — CBC WITH DIFFERENTIAL/PLATELET
Abs Immature Granulocytes: 0.04 10*3/uL (ref 0.00–0.07)
Basophils Absolute: 0.1 10*3/uL (ref 0.0–0.1)
Basophils Relative: 1 %
Eosinophils Absolute: 0.1 10*3/uL (ref 0.0–0.5)
Eosinophils Relative: 2 %
HCT: 45.3 % (ref 39.0–52.0)
Hemoglobin: 15 g/dL (ref 13.0–17.0)
Immature Granulocytes: 1 %
Lymphocytes Relative: 37 %
Lymphs Abs: 2.9 10*3/uL (ref 0.7–4.0)
MCH: 29.8 pg (ref 26.0–34.0)
MCHC: 33.1 g/dL (ref 30.0–36.0)
MCV: 90.1 fL (ref 80.0–100.0)
Monocytes Absolute: 0.6 10*3/uL (ref 0.1–1.0)
Monocytes Relative: 8 %
Neutro Abs: 4 10*3/uL (ref 1.7–7.7)
Neutrophils Relative %: 51 %
Platelets: 277 10*3/uL (ref 150–400)
RBC: 5.03 MIL/uL (ref 4.22–5.81)
RDW: 14.2 % (ref 11.5–15.5)
WBC: 7.8 10*3/uL (ref 4.0–10.5)
nRBC: 0 % (ref 0.0–0.2)

## 2021-03-13 LAB — BASIC METABOLIC PANEL
Anion gap: 10 (ref 5–15)
BUN: 14 mg/dL (ref 6–20)
CO2: 24 mmol/L (ref 22–32)
Calcium: 8.8 mg/dL — ABNORMAL LOW (ref 8.9–10.3)
Chloride: 106 mmol/L (ref 98–111)
Creatinine, Ser: 0.74 mg/dL (ref 0.61–1.24)
GFR, Estimated: >60 mL/min (ref >60)
Glucose, Bld: 143 mg/dL — ABNORMAL HIGH (ref 70–99)
Potassium: 3.4 mmol/L — ABNORMAL LOW (ref 3.5–5.1)
Sodium: 140 mmol/L (ref 135–145)

## 2021-03-13 LAB — MAGNESIUM: Magnesium: 2.1 mg/dL (ref 1.7–2.4)

## 2021-03-13 MED ORDER — POTASSIUM CHLORIDE CRYS ER 20 MEQ PO TBCR
40.0000 meq | EXTENDED_RELEASE_TABLET | Freq: Once | ORAL | Status: AC
  Administered 2021-03-13: 40 meq via ORAL
  Filled 2021-03-13: qty 2

## 2021-03-13 MED ORDER — ADENOSINE 6 MG/2ML IV SOLN
INTRAVENOUS | Status: AC
  Administered 2021-03-13: 6 mg via INTRAVENOUS
  Filled 2021-03-13: qty 2

## 2021-03-13 MED ORDER — POTASSIUM CHLORIDE CRYS ER 20 MEQ PO TBCR: 20.0000 meq | EXTENDED_RELEASE_TABLET | Freq: Three times a day (TID) | ORAL | 1 refills | Status: DC

## 2021-03-13 MED ORDER — ADENOSINE 6 MG/2ML IV SOLN: 6.0000 mg | Freq: Once | INTRAVENOUS | Status: AC

## 2021-03-13 NOTE — ED Provider Notes (Signed)
[facility] EMERGENCY DEPARTMENT Provider Note   CSN: [id number] Arrival date & time: 03/13/21  0439     History Chief Complaint  Patient presents with   Palpitations    Anthony Huff is a 40 y.o. male.  The history is provided by the patient.  Palpitations He has a history of supraventricular tachycardia, hypokalemia, hypomagnesemia and comes in with his heart racing.  This started about 2 hours ago.  He denies chest pain, heaviness, tightness, pressure.  Denies any dyspnea.  This is similar to his prior episode of supraventricular tachycardia.  He states that his potassium and magnesium were low at that time and he is concerned that they are low again.  He did not try anything at home to treat his symptoms.   Past Medical History:  Diagnosis Date   COVID-17 April 2020   SVT (supraventricular tachycardia) [facility])     Patient Active Problem List   Diagnosis Date Noted   Hypokalemia 02/23/2021   Hypomagnesemia 02/23/2021   Noncompliance w/medication treatment due to intermit use of medication 02/23/2021   SVT (supraventricular tachycardia) (HCC) 02/22/2021    Past Surgical History:  Procedure Laterality Date   WISDOM TOOTH EXTRACTION         Family History  Problem Relation Age of Onset   Heart failure Mother    Diabetes Mother    Cancer Father    Heart failure Father     Social History   Tobacco Use   Smoking status: Former    Types: Cigarettes    Start date: 03/31/2005    Quit date: 03/31/2019    Years since quitting: 1.9   Smokeless tobacco: Never  Vaping Use   Vaping Use: Never used  Substance Use Topics   Alcohol use: No   Drug use: Not Currently    Types: Marijuana    Home Medications Prior to Admission medications   Medication Sig Start Date End Date Taking? Authorizing Provider  magnesium oxide (MAG-OX) 400 MG tablet Take 400 mg by mouth daily.    [provider]  metoprolol succinate (TOPROL-XL) 50 MG 24 hr tablet Take 1 tablet  (50 mg total) by mouth daily. Take with or immediately following a meal. 10/08/20 02/22/21  Jonelle Sidle, MD  potassium chloride SA (KLOR-CON) 20 MEQ tablet Take 1 tablet (20 mEq total) by mouth 2 (two) times daily. 02/23/21   Cleora Fleet, MD    Allergies    Penicillins  Review of Systems   Review of Systems  Cardiovascular:  Positive for palpitations.  All other systems reviewed and are negative.  Physical Exam Updated Vital Signs BP 106/84    Pulse (!) 160    Resp (!) 22    Ht 6' (1.829 m)    Wt (!) 156.5 kg    SpO2 98%    BMI 46.79 kg/m   Physical Exam Vitals and nursing note reviewed.  40 year old male, resting comfortably and in no acute distress. Vital signs are significant for rapid heart rate and slightly elevated respiratory rate. Oxygen saturation is 98%, which is normal. Head is normocephalic and atraumatic. PERRLA, EOMI. Oropharynx is clear. Neck is nontender and supple without adenopathy or JVD. Back is nontender and there is no CVA tenderness. Lungs are clear without rales, wheezes, or rhonchi. Chest is nontender. Heart is tachycardic without murmur. Abdomen is soft, flat, nontender. Extremities have no cyanosis or edema, full range of motion is present. Skin is warm and  dry without rash. Neurologic: Mental status is normal, cranial nerves are intact, moves all extremities equally.  ED Results / Procedures / Treatments   Labs (all labs ordered are listed, but only abnormal results are displayed) Labs Reviewed  BASIC METABOLIC PANEL - Abnormal; Notable for the following components:      Result Value   Potassium 3.4 (*)    Glucose, Bld 143 (*)    Calcium 8.8 (*)    All other components within normal limits  CBC WITH DIFFERENTIAL/PLATELET  MAGNESIUM    EKG EKG Interpretation  Date/Time:  Thursday March 13 2021 04:59:35 EST Ventricular Rate:  163 PR Interval:    QRS Duration: 103 QT Interval:  289 QTC Calculation: 476 R Axis:   133 Text  Interpretation: Supraventricular tachycardia S1,S2,S3 pattern Abnormal R-wave progression, late transition Minimal ST depression, inferior leads Borderline prolonged QT interval When compared with ECG of 02/22/2021, No significant change was found Confirmed by Delora Fuel ([id number]) on 03/13/2021 5:02:47 AM  Procedures .Cardioversion  Date/Time: 03/13/2021 5:20 AM Performed by: Delora Fuel, MD Authorized by: Delora Fuel, MD   Consent:    Consent obtained:  Verbal   Consent given by:  Patient   Risks discussed:  Induced arrhythmia and death   Alternatives discussed:  Rate-control medication Pre-procedure details:    Cardioversion basis:  Emergent   Rhythm:  Supraventricular tachycardia   Electrode placement:  Anterior-posterior Patient sedated: No Attempt one:    Cardioversion mode attempt one: Adenosine 6 mg IV push.   Shock outcome:  Conversion to normal sinus rhythm Post-procedure details:    Patient status:  Awake   Patient tolerance of procedure:  Tolerated well, no immediate complications   CRITICAL CARE Performed by: Delora Fuel Total critical care time: 35 minutes Critical care time was exclusive of separately billable procedures and treating other patients. Critical care was necessary to treat or prevent imminent or life-threatening deterioration. Critical care was time spent personally by me on the following activities: development of treatment plan with patient and/or surrogate as well as nursing, discussions with consultants, evaluation of patient's response to treatment, examination of patient, obtaining history from patient or surrogate, ordering and performing treatments and interventions, ordering and review of laboratory studies, ordering and review of radiographic studies, pulse oximetry and re-evaluation of patient's condition.  Medications Ordered in ED Medications  adenosine (ADENOCARD) 6 MG/2ML injection 6 mg (has no administration in time range)  adenosine  (ADENOCARD) 6 MG/2ML injection (has no administration in time range)    ED Course  I have reviewed the triage vital signs and the nursing notes.  Pertinent labs & imaging results that were available during my care of the patient were reviewed by me and considered in my medical decision making (see chart for details).    MDM Rules/Calculators/A&P                         Supraventricular tachycardia.  ECG shows supraventricular tachycardia and pattern very similar to his prior episode.  Old records are reviewed showing he actually had to be admitted because of immediate recurrence of SVT.  Screening labs are obtained.  He is given a dose of adenosine with successful conversion to sinus rhythm.  He will need to be observed in the emergency department while waiting for labs to come back.  Rhythm has been stable following cardioversion.  Labs are come back significant for borderline hypokalemia.  He is given a dose of oral potassium  and is instructed to increase his home potassium to 3 times a day from 2 times a day.  Magnesium is normal at 2.1.  Patient also states that he had not been able to get into the endocrinology office where he had been referred when hospitalized last month.  New ambulatory referral is given to endocrinology.  Final Clinical Impression(s) / ED Diagnoses Final diagnoses:  Supraventricular tachycardia ([place])  Hypokalemia    Rx / DC Orders ED Discharge Orders          Ordered    Ambulatory referral to Endocrinology       Comments: Hyperaldosteronism   03/13/21 0603    potassium chloride SA (KLOR-CON M) 20 MEQ tablet  3 times daily        03/13/21 [id number]             Delora Fuel, MD [phone]

## 2021-03-13 NOTE — ED Notes (Signed)
Pt with successful cardioversion via adenosine with EDP and 2 RN at bedside.

## 2021-03-13 NOTE — ED Triage Notes (Signed)
Pt c/o rapid heart rate, states it has gotten up to 213 tonight. States he has felt SOB off and on since 1am. Denies chest pain.

## 2021-04-14 NOTE — Congregational Nurse Program (Signed)
Pt arrived at [facility] to receive assistance in establishing care with a primary care provider. Pt completed [facility] enrollment with [facility] on today (1.16.23).  Pt states he is need of PCP to assist with his current management of heart related issue (SVT -supraventricular tachycardia) (see chart review  notes for recent ER visit and hospitalization  A current and past medical hx interview was completed.  States has hx of seeing cardiologist and states has an upcoming appointment endcrinogolist  on 1.23.23  Plan - Scheduled first appointment with [facility] for  (Thurs) Apr 17, 2021  @ 9:30 am   -Referral sent to RN Nurse Case Manager, Norval Gable, for initial and continous medical case management upon completion of first medical appointment with the [facility] [place].

## 2021-04-17 ENCOUNTER — Encounter: Payer: Self-pay | Admitting: Physician Assistant

## 2021-04-17 ENCOUNTER — Ambulatory Visit: Payer: Self-pay | Admitting: Physician Assistant

## 2021-04-17 ENCOUNTER — Other Ambulatory Visit: Payer: Self-pay

## 2021-04-17 VITALS — BP 129/78 | HR 70 | Temp 98.3°F | Ht 72.5 in | Wt 343.0 lb

## 2021-04-17 DIAGNOSIS — Z7689 Persons encountering health services in other specified circumstances: Secondary | ICD-10-CM

## 2021-04-17 DIAGNOSIS — I10 Essential (primary) hypertension: Secondary | ICD-10-CM

## 2021-04-17 DIAGNOSIS — Z833 Family history of diabetes mellitus: Secondary | ICD-10-CM

## 2021-04-17 DIAGNOSIS — Z131 Encounter for screening for diabetes mellitus: Secondary | ICD-10-CM

## 2021-04-17 DIAGNOSIS — Z13220 Encounter for screening for lipoid disorders: Secondary | ICD-10-CM

## 2021-04-17 NOTE — Progress Notes (Signed)
BP 129/78    Pulse 70    Temp 98.3 F (36.8 C)    Ht 6' 0.5" (1.842 m)    Wt (!) 343 lb (155.6 kg)    SpO2 97%    BMI 45.88 kg/m    Subjective:    Patient ID: Anthony Huff, male    DOB: [birth year], 41 y.o.   MRN: [id number]  HPI: Anthony Huff is a 41 y.o. male presenting on 04/17/2021 for New Patient (Initial Visit)   HPI  Chief Complaint  Patient presents with   New Patient (Initial Visit)    Pt is 40yoM who has been in hospital recently with SVT.  He has been treated with cardioversion and with adenosine.   He was recommended to get evaluation  with endocrinology  to evaluation for hyperaldosteronism.  He has appointment next week for this.  Pt is currently Not working but is looking.  pt asks about taking an aspirin daily  He Got 1st 2 covid doses  He says he is feeling fine today and has no complaints.    Relevant past medical, surgical, family and social history reviewed and updated as indicated. Interim medical history since our last visit reviewed. Allergies and medications reviewed and updated.   Current Outpatient Medications:    magnesium oxide (MAG-OX) 400 MG tablet, Take 400 mg by mouth daily., Disp: , Rfl:    metoprolol succinate (TOPROL-XL) 50 MG 24 hr tablet, Take 1 tablet (50 mg total) by mouth daily. Take with or immediately following a meal., Disp: 30 tablet, Rfl: 11   potassium chloride SA (KLOR-CON M) 20 MEQ tablet, Take 1 tablet (20 mEq total) by mouth 3 (three) times daily., Disp: 90 tablet, Rfl: 1    Review of Systems  Per HPI unless specifically indicated above     Objective:    BP 129/78    Pulse 70    Temp 98.3 F (36.8 C)    Ht 6' 0.5" (1.842 m)    Wt (!) 343 lb (155.6 kg)    SpO2 97%    BMI 45.88 kg/m   Wt Readings from Last 3 Encounters:  04/17/21 (!) 343 lb (155.6 kg)  03/13/21 (!) 345 lb (156.5 kg)  02/22/21 (!) 344 lb 2.2 oz (156.1 kg)    Physical Exam Vitals reviewed.  Constitutional:      General: He is not in acute  distress.    Appearance: He is well-developed. He is obese. He is not ill-appearing.  HENT:     Head: Normocephalic and atraumatic.  Eyes:     Extraocular Movements: Extraocular movements intact.     Conjunctiva/sclera: Conjunctivae normal.     Pupils: Pupils are equal, round, and reactive to light.  Neck:     Thyroid: No thyromegaly.  Cardiovascular:     Rate and Rhythm: Normal rate and regular rhythm.  Pulmonary:     Effort: Pulmonary effort is normal.     Breath sounds: Normal breath sounds. No wheezing or rales.  Abdominal:     General: Bowel sounds are normal.     Palpations: Abdomen is soft. There is no mass.     Tenderness: There is no abdominal tenderness.  Musculoskeletal:     Cervical back: Neck supple.     Right lower leg: No edema.     Left lower leg: No edema.  Lymphadenopathy:     Cervical: No cervical adenopathy.  Skin:    General: Skin is warm and dry.  Findings: No rash.  Neurological:     Mental Status: He is alert and oriented to person, place, and time.  Psychiatric:        Behavior: Behavior normal.          Assessment & Plan:    Encounter Diagnoses  Name Primary?   Encounter to establish care Yes   Primary hypertension    Screening cholesterol level    Screening for diabetes mellitus    Family history of diabetes mellitus    Morbid obesity (HCC)      Discussed with pt that He does not need an aspirin as there are no indications at present.  Will check additional Labs - Cmp lipids a1c  No changes to medications today  Discussed with pt will start FIT testing at age 32 for colon cancer screening.  Encouraged pt to get Covid vaccination booster  Discussed with pt that he should work on [employer] management with healthy diet and exercise to reduce risk for M&M.    Pt to see endocrinology  next week as scheduled  Pt to follow up here 3-4 weeks.  He is to contact office sooner prn

## 2021-04-18 ENCOUNTER — Other Ambulatory Visit: Payer: Self-pay

## 2021-04-18 ENCOUNTER — Other Ambulatory Visit (HOSPITAL_COMMUNITY)
Admission: RE | Admit: 2021-04-18 | Discharge: 2021-04-18 | Disposition: A | Discharge status: Home / Self Care | Payer: Medicaid Other | Source: Ambulatory Visit | Attending: Physician Assistant | Admitting: Physician Assistant

## 2021-04-18 DIAGNOSIS — I10 Essential (primary) hypertension: Secondary | ICD-10-CM | POA: Diagnosis present

## 2021-04-18 DIAGNOSIS — Z13220 Encounter for screening for lipoid disorders: Secondary | ICD-10-CM | POA: Diagnosis present

## 2021-04-18 DIAGNOSIS — Z833 Family history of diabetes mellitus: Secondary | ICD-10-CM | POA: Insufficient documentation

## 2021-04-18 DIAGNOSIS — Z131 Encounter for screening for diabetes mellitus: Secondary | ICD-10-CM | POA: Insufficient documentation

## 2021-04-18 DIAGNOSIS — R69 Illness, unspecified: Secondary | ICD-10-CM | POA: Diagnosis present

## 2021-04-18 LAB — LIPID PANEL
Cholesterol: 81 mg/dL (ref 0–200)
HDL: 34 mg/dL — ABNORMAL LOW (ref >40)
LDL Cholesterol: 28 mg/dL (ref 0–99)
Total CHOL/HDL Ratio: 2.4 RATIO
Triglycerides: 95 mg/dL (ref <150)
VLDL: 19 mg/dL (ref 0–40)

## 2021-04-18 LAB — COMPREHENSIVE METABOLIC PANEL
ALT: 22 U/L (ref 0–44)
AST: 20 U/L (ref 15–41)
Albumin: 4.2 g/dL (ref 3.5–5.0)
Alkaline Phosphatase: 58 U/L (ref 38–126)
Anion gap: 7 (ref 5–15)
BUN: 11 mg/dL (ref 6–20)
CO2: 26 mmol/L (ref 22–32)
Calcium: 8.7 mg/dL — ABNORMAL LOW (ref 8.9–10.3)
Chloride: 104 mmol/L (ref 98–111)
Creatinine, Ser: 0.65 mg/dL (ref 0.61–1.24)
GFR, Estimated: >60 mL/min (ref >60)
Glucose, Bld: 114 mg/dL — ABNORMAL HIGH (ref 70–99)
Potassium: 3.8 mmol/L (ref 3.5–5.1)
Sodium: 137 mmol/L (ref 135–145)
Total Bilirubin: 1.3 mg/dL — ABNORMAL HIGH (ref 0.3–1.2)
Total Protein: 7.3 g/dL (ref 6.5–8.1)

## 2021-04-18 LAB — HEMOGLOBIN A1C
Hgb A1c MFr Bld: 5.8 % — ABNORMAL HIGH (ref 4.8–5.6)
Mean Plasma Glucose: 120 mg/dL

## 2021-04-21 ENCOUNTER — Ambulatory Visit: Payer: Self-pay | Admitting: Internal Medicine

## 2021-04-21 NOTE — Progress Notes (Deleted)
Name: Anthony Huff  MRN/ DOB: [id number], February 22, 1981    Age/ Sex: 41 y.o., male    PCP: Jacquelin Hawking, Cordelia Poche   Reason for Endocrinology Evaluation: Hypokalemia      Date of Initial Endocrinology Evaluation: 04/21/2021     HPI: Anthony Huff is a 41 y.o. male with a past medical history of ***. The patient presented for initial endocrinology clinic visit on 04/21/2021 for consultative assistance with his Hypokalemia .   Pt presented to the ED 08/04/2020 with complaints of palpitations associated with SOB , went to SVT , 180-200's , was given adenosine. TFT"s were normal but noted hypokalemia with a potassium of 2.8 mmol/L which was replenished.    Had another ED presentation 02/22/2021 for SVT was found again to have low K+ at 2.6 mmol/L   HISTORY:  Past Medical History:  Past Medical History:  Diagnosis Date   COVID-17 April 2020   SVT (supraventricular tachycardia) (HCC)    Past Surgical History:  Past Surgical History:  Procedure Laterality Date   WISDOM TOOTH EXTRACTION      Social History:  reports that he quit smoking about 2 years ago. His smoking use included cigarettes. He started smoking about 16 years ago. He has never used smokeless tobacco. He reports current drug use. Drug: Marijuana. He reports that he does not drink alcohol. Family History: family history includes Cancer in his father; Diabetes in his mother; Heart disease in his mother; Heart failure in his father.   HOME MEDICATIONS: Allergies as of 04/21/2021       Reactions   Penicillins Shortness Of Breath        Medication List        Accurate as of April 21, 2021  7:24 AM. If you have any questions, ask your nurse or doctor.          magnesium oxide 400 MG tablet Commonly known as: MAG-OX Take 400 mg by mouth daily.   metoprolol succinate 50 MG 24 hr tablet Commonly known as: TOPROL-XL Take 1 tablet (50 mg total) by mouth daily. Take with or immediately following a  meal.   potassium chloride SA 20 MEQ tablet Commonly known as: KLOR-CON M Take 1 tablet (20 mEq total) by mouth 3 (three) times daily.          REVIEW OF SYSTEMS: A comprehensive ROS was conducted with the patient and is negative except as per HPI and below:  ROS     OBJECTIVE:  VS: There were no vitals taken for this visit.   Wt Readings from Last 3 Encounters:  04/17/21 (!) 343 lb (155.6 kg)  03/13/21 (!) 345 lb (156.5 kg)  02/22/21 (!) 344 lb 2.2 oz (156.1 kg)     EXAM: General: Pt appears well and is in NAD  Neck: General: Supple without adenopathy. Thyroid: Thyroid size normal.  No goiter or nodules appreciated. No thyroid bruit.  Lungs: Clear with good BS bilat with no rales, rhonchi, or wheezes  Heart: Auscultation: RRR.  Abdomen: Normoactive bowel sounds, soft, nontender, without masses or organomegaly palpable  Extremities:  BL LE: No pretibial edema normal ROM and strength.  Skin: Hair: Texture and amount normal with gender appropriate distribution Skin Inspection: No rashes Skin Palpation: Skin temperature, texture, and thickness normal to palpation  Neuro:  DTRs: 2+ and symmetric in UE without delay in relaxation phase  Mental Status: Judgment, insight: Intact Orientation: Oriented to time, place, and person Mood  and affect: No depression, anxiety, or agitation     DATA REVIEWED: ***    ASSESSMENT/PLAN/RECOMMENDATIONS:   ***    Medications :  Signed electronically by: Lyndle Herrlich, MD  [facility] Endocrinology  [facility] [address] [place], [place] [address] Phone: [phone] FAX: [phone]   CC: Jacquelin Hawking, PA-C [address] [place] [address] Phone: [phone] Fax: [phone]   Return to Endocrinology clinic as below: Future Appointments  Date Time Provider Department Center  04/21/2021  9:30 AM Jakob Kimberlin, Konrad Dolores, MD [id number] None  05/07/2021  1:30 PM  Jacquelin Hawking, PA-C [facility] None

## 2021-05-07 ENCOUNTER — Other Ambulatory Visit: Payer: Self-pay

## 2021-05-07 ENCOUNTER — Ambulatory Visit: Payer: Self-pay | Admitting: Physician Assistant

## 2021-05-07 ENCOUNTER — Encounter: Payer: Self-pay | Admitting: Physician Assistant

## 2021-05-07 VITALS — BP 138/82 | HR 87 | Temp 97.0°F | Wt 347.5 lb

## 2021-05-07 DIAGNOSIS — R7303 Prediabetes: Secondary | ICD-10-CM

## 2021-05-07 DIAGNOSIS — I10 Essential (primary) hypertension: Secondary | ICD-10-CM

## 2021-05-07 NOTE — Patient Instructions (Signed)

## 2021-05-07 NOTE — Progress Notes (Signed)
BP (!) 141/70    Pulse 87    Temp (!) 97 F (36.1 C)    Wt (!) 347 lb 8 oz (157.6 kg)    SpO2 92%    BMI 46.48 kg/m    Subjective:    Patient ID: Anthony Huff, male    DOB: [birth year], 41 y.o.   MRN: [id number]  HPI: Anthony Huff is a 41 y.o. male presenting on 05/07/2021 for Follow-up   HPI  Chief Complaint  Patient presents with   Follow-up   Pt is 40yoM who was seen last month to establish care.  He had appointment with endocrinology to get evaluation for hyperaldosteronism but the appointment was changed to next week.    He has had no changes since last month.      Relevant past medical, surgical, family and social history reviewed and updated as indicated. Interim medical history since our last visit reviewed. Allergies and medications reviewed and updated.   Current Outpatient Medications:    magnesium oxide (MAG-OX) 400 MG tablet, Take 400 mg by mouth daily., Disp: , Rfl:    metoprolol succinate (TOPROL-XL) 50 MG 24 hr tablet, Take 1 tablet (50 mg total) by mouth daily. Take with or immediately following a meal., Disp: 30 tablet, Rfl: 11   potassium chloride SA (KLOR-CON M) 20 MEQ tablet, Take 1 tablet (20 mEq total) by mouth 3 (three) times daily., Disp: 90 tablet, Rfl: 1    Review of Systems  Per HPI unless specifically indicated above     Objective:    BP (!) 141/70    Pulse 87    Temp (!) 97 F (36.1 C)    Wt (!) 347 lb 8 oz (157.6 kg)    SpO2 92%    BMI 46.48 kg/m   Wt Readings from Last 3 Encounters:  05/07/21 (!) 347 lb 8 oz (157.6 kg)  04/17/21 (!) 343 lb (155.6 kg)  03/13/21 (!) 345 lb (156.5 kg)    Physical Exam Vitals reviewed.  Constitutional:      General: He is not in acute distress.    Appearance: He is well-developed. He is obese. He is not ill-appearing.  HENT:     Head: Normocephalic and atraumatic.  Cardiovascular:     Rate and Rhythm: Normal rate and regular rhythm.  Pulmonary:     Effort: Pulmonary effort is normal.      Breath sounds: Normal breath sounds. No wheezing.  Musculoskeletal:     Cervical back: Neck supple.     Right lower leg: No edema.     Left lower leg: No edema.  Lymphadenopathy:     Cervical: No cervical adenopathy.  Skin:    General: Skin is warm and dry.  Neurological:     Mental Status: He is alert and oriented to person, place, and time.  Psychiatric:        Attention and Perception: Attention normal.        Speech: Speech normal.        Behavior: Behavior normal. Behavior is cooperative.    Results for orders placed or performed during the hospital encounter of 04/18/21  Hemoglobin A1c  Result Value Ref Range   Hgb A1c MFr Bld 5.8 (H) 4.8 - 5.6 %   Mean Plasma Glucose 120 mg/dL  Lipid panel  Result Value Ref Range   Cholesterol 81 0 - 200 mg/dL   Triglycerides 95 <330 mg/dL   HDL 34 (L) >07 mg/dL   Total  CHOL/HDL Ratio 2.4 RATIO   VLDL 19 0 - 40 mg/dL   LDL Cholesterol 28 0 - 99 mg/dL  Comprehensive metabolic panel  Result Value Ref Range   Sodium 137 135 - 145 mmol/L   Potassium 3.8 3.5 - 5.1 mmol/L   Chloride 104 98 - 111 mmol/L   CO2 26 22 - 32 mmol/L   Glucose, Bld 114 (H) 70 - 99 mg/dL   BUN 11 6 - 20 mg/dL   Creatinine, Ser 5.28 0.61 - 1.24 mg/dL   Calcium 8.7 (L) 8.9 - 10.3 mg/dL   Total Protein 7.3 6.5 - 8.1 g/dL   Albumin 4.2 3.5 - 5.0 g/dL   AST 20 15 - 41 U/L   ALT 22 0 - 44 U/L   Alkaline Phosphatase 58 38 - 126 U/L   Total Bilirubin 1.3 (H) 0.3 - 1.2 mg/dL   GFR, Estimated >41 >32 mL/min   Anion gap 7 5 - 15      Assessment & Plan:    Encounter Diagnoses  Name Primary?   Primary hypertension Yes   Morbid obesity (HCC)    Prediabetes      -reviewed labs with pt -counseled on prediabetes and gave reading information -encouraged weight management with healthy diet and regular exercise -follow up 1 month to make sure he's been seen by endocrine.  Will make sure pt  K+ has been rechecked.  Pt to contact office sooner prn

## 2021-05-12 ENCOUNTER — Ambulatory Visit: Payer: Self-pay | Admitting: Internal Medicine

## 2021-05-12 ENCOUNTER — Other Ambulatory Visit: Payer: Self-pay

## 2021-05-12 ENCOUNTER — Ambulatory Visit: Payer: Medicaid Other | Admitting: Internal Medicine

## 2021-05-12 ENCOUNTER — Encounter: Payer: Self-pay | Admitting: Internal Medicine

## 2021-05-12 VITALS — BP 140/76 | HR 95 | Ht 72.5 in | Wt 334.6 lb

## 2021-05-12 DIAGNOSIS — E876 Hypokalemia: Secondary | ICD-10-CM

## 2021-05-12 NOTE — Progress Notes (Signed)
Name: Anthony Huff  MRN/ DOB: [id number], 05-11-1980    Age/ Sex: 41 y.o., male    PCP: Jacquelin Hawking, Cordelia Poche   Reason for Endocrinology Evaluation: Hypokalemia     Date of Initial Endocrinology Evaluation: 05/12/2021     HPI: Anthony Huff is a 41 y.o. male with a past medical history of HTN , pre-diabetes and SVT. The patient presented for initial endocrinology clinic visit on 05/12/2021 for consultative assistance with his Hypokalemia .   Pt has been referred by cardiology for further evaluation of hypokalemia.    In review of his chart, the pt has been noted with multiple episode of hypokalemia since 2022  He required multiple ED visits for HTN, Hypokalemia, SVT and SOB over the past 2 years.     Pt has been noted with fluctuating weight gain since 2013  He denies chronic diarrhea  Denies bruising  Denies proximal muscle weakness  He did have elevated BP in the past  Denies muscle cramps  Does not eat licorice   No Fh of hypokalemia      Kcl 20 meq TID  Mag-ox 4 days week   HISTORY:  Past Medical History:  Past Medical History:  Diagnosis Date   COVID-17 April 2020   SVT (supraventricular tachycardia) (HCC)    Past Surgical History:  Past Surgical History:  Procedure Laterality Date   WISDOM TOOTH EXTRACTION      Social History:  reports that he quit smoking about 2 years ago. His smoking use included cigarettes. He started smoking about 16 years ago. He has never used smokeless tobacco. He reports current drug use. Drug: Marijuana. He reports that he does not drink alcohol. Family History: family history includes Cancer in his father; Diabetes in his mother; Heart disease in his mother; Heart failure in his father.   HOME MEDICATIONS: Allergies as of 05/12/2021       Reactions   Penicillins Shortness Of Breath        Medication List        Accurate as of May 12, 2021  7:29 AM. If you have any questions, ask your nurse or  doctor.          magnesium oxide 400 MG tablet Commonly known as: MAG-OX Take 400 mg by mouth daily.   metoprolol succinate 50 MG 24 hr tablet Commonly known as: TOPROL-XL Take 1 tablet (50 mg total) by mouth daily. Take with or immediately following a meal.   potassium chloride SA 20 MEQ tablet Commonly known as: KLOR-CON M Take 1 tablet (20 mEq total) by mouth 3 (three) times daily.          REVIEW OF SYSTEMS: A comprehensive ROS was conducted with the patient and is negative except as per HPI    OBJECTIVE:  VS: BP 140/76 (BP Location: Left Arm, Patient Position: Sitting, Cuff Size: Large)    Pulse 95    Ht 6' 0.5" (1.842 m)    Wt (!) 334 lb 9.6 oz (151.8 kg)    SpO2 98%    BMI 44.76 kg/m    Wt Readings from Last 3 Encounters:  05/07/21 (!) 347 lb 8 oz (157.6 kg)  04/17/21 (!) 343 lb (155.6 kg)  03/13/21 (!) 345 lb (156.5 kg)     EXAM: General: Pt appears well and is in NAD  Neck: General: Supple without adenopathy. Thyroid: Thyroid size normal.  No goiter or nodules appreciated.   Lungs: Clear  with good BS bilat with no rales, rhonchi, or wheezes  Heart: Auscultation: RRR.  Abdomen: Normoactive bowel sounds, soft, nontender, without masses or organomegaly palpable  Extremities:  BL LE: No pretibial edema normal ROM and strength.  Neuro:  DTRs: 2+ and symmetric in UE without delay in relaxation phase  Mental Status: Judgment, insight: Intact Orientation: Oriented to time, place, and perso Mood and affect: No depression, anxiety, or agitation     DATA REVIEWED:  Latest Reference Range & Units 04/18/21 09:10  Sodium 135 - 145 mmol/L 137  Potassium 3.5 - 5.1 mmol/L 3.8  Chloride 98 - 111 mmol/L 104  CO2 22 - 32 mmol/L 26  Glucose 70 - 99 mg/dL 468 (H)  Mean Plasma Glucose mg/dL 032  BUN 6 - 20 mg/dL 11  Creatinine 1.22 - 4.82 mg/dL 5.00  Calcium 8.9 - 37.0 mg/dL 8.7 (L)  Anion gap 5 - 15  7  Alkaline Phosphatase 38 - 126 U/L 58  Albumin 3.5 - 5.0  g/dL 4.2  AST 15 - 41 U/L 20  ALT 0 - 44 U/L 22  Total Protein 6.5 - 8.1 g/dL 7.3  Total Bilirubin 0.3 - 1.2 mg/dL 1.3 (H)  GFR, Estimated >60 mL/min >60      Latest Reference Range & Units 04/18/21 09:10  Total CHOL/HDL Ratio RATIO 2.4  Cholesterol 0 - 200 mg/dL 81  HDL Cholesterol >48 mg/dL 34 (L)  LDL (calc) 0 - 99 mg/dL 28  Triglycerides <889 mg/dL 95  VLDL 0 - 40 mg/dL 19    ASSESSMENT/PLAN/RECOMMENDATIONS:   Hypokalemia :  - We discussed D/Dp hyperaldosteronism vs cushing syndrome  - Will proceed with renin: aldo and 24-hr urinary cortisol - He lives in [place] and was provided with printed lab orders to take a local [facility]  - We also discussed renal causes and apparent mineralocorticoid excess   Medication  Continue Kcl 20 mEq TID   F/U in 3 months   Signed electronically by: Lyndle Herrlich, MD  [facility] Endocrinology  [facility] [address] [place], [place] [address] Phone: [phone] FAX: [phone]   CC: Jacquelin Hawking, PA-C [address] [place] [address] Phone: [phone] Fax: [phone]   Return to Endocrinology clinic as below: Future Appointments  Date Time Provider Department Center  05/12/2021  9:50 AM Noni Stonesifer, Konrad Dolores, MD [facility] None  06/04/2021  9:30 AM Jacquelin Hawking, PA-C [facility] None

## 2021-05-12 NOTE — Patient Instructions (Signed)

## 2021-05-15 ENCOUNTER — Other Ambulatory Visit: Payer: Self-pay

## 2021-05-15 ENCOUNTER — Other Ambulatory Visit (HOSPITAL_COMMUNITY)
Admission: RE | Admit: 2021-05-15 | Discharge: 2021-05-15 | Disposition: A | Discharge status: Home / Self Care | Payer: Medicaid Other | Source: Ambulatory Visit | Attending: Internal Medicine | Admitting: Internal Medicine

## 2021-05-15 DIAGNOSIS — E876 Hypokalemia: Secondary | ICD-10-CM | POA: Diagnosis not present

## 2021-05-15 LAB — BASIC METABOLIC PANEL
Anion gap: 11 (ref 5–15)
BUN: 10 mg/dL (ref 6–20)
CO2: 25 mmol/L (ref 22–32)
Calcium: 9.3 mg/dL (ref 8.9–10.3)
Chloride: 102 mmol/L (ref 98–111)
Creatinine, Ser: 0.69 mg/dL (ref 0.61–1.24)
GFR, Estimated: >60 mL/min (ref >60)
Glucose, Bld: 122 mg/dL — ABNORMAL HIGH (ref 70–99)
Potassium: 3.1 mmol/L — ABNORMAL LOW (ref 3.5–5.1)
Sodium: 138 mmol/L (ref 135–145)

## 2021-05-15 LAB — MAGNESIUM: Magnesium: 1.8 mg/dL (ref 1.7–2.4)

## 2021-05-15 LAB — CREATININE, URINE, 24 HOUR
Collection Interval-UCRE24: 24 hours
Creatinine, 24H Ur: 2274 mg/d — ABNORMAL HIGH (ref 800–2000)
Creatinine, Urine: 324.83 mg/dL
Urine Total Volume-UCRE24: 700 mL

## 2021-05-15 LAB — CORTISOL: Cortisol, Plasma: 9.2 ug/dL

## 2021-05-16 LAB — ACTH: C206 ACTH: 20.7 pg/mL (ref 7.2–63.3)

## 2021-05-19 LAB — CORTISOL, URINE, FREE
Cortisol (Ur), Free: 38 ug/24 hr (ref 5–64)
Cortisol,F,ug/L,U: 54 ug/L
Total Volume: 700

## 2021-05-20 ENCOUNTER — Telehealth: Payer: Self-pay | Admitting: Internal Medicine

## 2021-05-20 MED ORDER — POTASSIUM CHLORIDE CRYS ER 20 MEQ PO TBCR
40.0000 meq | EXTENDED_RELEASE_TABLET | Freq: Two times a day (BID) | ORAL | 1 refills | Status: DC
Start: 2021-05-20 — End: 2021-08-12

## 2021-05-20 NOTE — Telephone Encounter (Signed)
Please let the pt know that his potassium is still low, please ask him to take potassium TWO tablets TWICE daily ( total of 4 tablets a day )   Let him know that his kidney function is normal  and his urine test is normal as well   I am waiting on one more hormone, will keep him posted.    Thanks

## 2021-05-20 NOTE — Telephone Encounter (Signed)
Attempted to contact patient no answer and vm is full 

## 2021-05-21 NOTE — Telephone Encounter (Signed)
Patient notified and verbalized understanding. 

## 2021-05-22 LAB — ALDOSTERONE + RENIN ACTIVITY W/ RATIO
ALDO / PRA Ratio: 38.9 — ABNORMAL HIGH (ref 0.0–30.0)
Aldosterone: 8.9 ng/dL (ref 0.0–30.0)
PRA LC/MS/MS: 0.229 ng/mL/hr (ref 0.167–5.380)

## 2021-05-26 ENCOUNTER — Telehealth: Payer: Self-pay | Admitting: Internal Medicine

## 2021-05-26 ENCOUNTER — Encounter: Payer: Self-pay | Admitting: Internal Medicine

## 2021-05-26 ENCOUNTER — Other Ambulatory Visit: Payer: Self-pay

## 2021-05-26 NOTE — Telephone Encounter (Signed)
Spoke to the patient on 05/26/2021 at about 11:30 AM    We discussed normal 24-hour urinary cortisol     I did discuss with the patient that his  Latest Reference Range & Units 05/15/21 11:33  ALDOSTERONE 0.0 - 30.0 ng/dL 8.9  PRA LC/MS/MS 5.366 - 5.380 ng/mL/hr 0.229  ALDO / PRA Ratio 0.0 - 30.0  38.9 (H)    Cortisol (Ur), Free 5 - 64 ug/24 hr 38    Will proceed with saline loading   Patient was advised that he will be scheduled for the infusion suite, will be given normal saline for 4 hours with aldosterone checkup prior and post infusion   Patient expressed understanding  An order with the protocol will be faxed to the infusion suite

## 2021-05-26 NOTE — Telephone Encounter (Signed)
Spoke with infusion center and they will get patient schedule for 06/05/21 at 8am. Order needs to be placed on there form and she will fax it back to [place].

## 2021-05-27 ENCOUNTER — Telehealth: Payer: Self-pay

## 2021-05-30 ENCOUNTER — Ambulatory Visit: Payer: Medicaid Other

## 2021-06-02 ENCOUNTER — Telehealth: Payer: Self-pay

## 2021-06-02 ENCOUNTER — Ambulatory Visit: Payer: Medicaid Other

## 2021-06-03 ENCOUNTER — Other Ambulatory Visit: Payer: Self-pay

## 2021-06-03 ENCOUNTER — Ambulatory Visit (INDEPENDENT_AMBULATORY_CARE_PROVIDER_SITE_OTHER): Payer: Medicaid Other

## 2021-06-03 VITALS — BP 136/84 | HR 73 | Temp 97.8°F | Resp 16 | Ht 72.0 in | Wt 348.8 lb

## 2021-06-03 DIAGNOSIS — E2740 Unspecified adrenocortical insufficiency: Secondary | ICD-10-CM

## 2021-06-03 DIAGNOSIS — E876 Hypokalemia: Secondary | ICD-10-CM | POA: Diagnosis not present

## 2021-06-03 MED ORDER — SODIUM CHLORIDE 0.9 % IV SOLN: Freq: Once | INTRAVENOUS | Status: DC | PRN

## 2021-06-03 MED ORDER — SODIUM CHLORIDE 0.9 % IV SOLN: INTRAVENOUS | Status: AC

## 2021-06-03 MED ORDER — FAMOTIDINE IN NACL 20-0.9 MG/50ML-% IV SOLN: 20.0000 mg | Freq: Once | INTRAVENOUS | Status: DC | PRN

## 2021-06-03 MED ORDER — DIPHENHYDRAMINE HCL 50 MG/ML IJ SOLN: 50.0000 mg | Freq: Once | INTRAMUSCULAR | Status: DC | PRN

## 2021-06-03 MED ORDER — CLONIDINE HCL 0.1 MG PO TABS: 0.1000 mg | ORAL_TABLET | Freq: Once | ORAL | Status: DC | PRN

## 2021-06-03 MED ORDER — ALBUTEROL SULFATE HFA 108 (90 BASE) MCG/ACT IN AERS: 2.0000 | INHALATION_SPRAY | Freq: Once | RESPIRATORY_TRACT | Status: DC | PRN

## 2021-06-03 MED ORDER — EPINEPHRINE 0.3 MG/0.3ML IJ SOAJ: 0.3000 mg | Freq: Once | INTRAMUSCULAR | Status: DC | PRN

## 2021-06-03 MED ORDER — METHYLPREDNISOLONE SODIUM SUCC 125 MG IJ SOLR: 125.0000 mg | Freq: Once | INTRAMUSCULAR | Status: DC | PRN

## 2021-06-03 NOTE — Progress Notes (Signed)
Diagnosis: Saline Loading/Aldosterone levels ? ?Provider:  Chilton Greathouse, MD ? ?Procedure: Infusion ? ?IV Type: Peripheral, IV Location: R Hand ? ?Normal Saline, Dose: 2L ? ?Infusion Start Time: 1031 ? ?Infusion Stop Time: 1510 ?Lab specimen collected for Aldosterone pre- and post infusion. BP remained stable. No Clonidine needed.  ? ?Post Infusion IV Care: Peripheral IV Discontinued ? ?Discharge: Condition: Good, Destination: Home . AVS provided to patient.  ? ?Performed by:  Nat Math, RN  ?  ?

## 2021-06-04 ENCOUNTER — Encounter: Payer: Self-pay | Admitting: Physician Assistant

## 2021-06-04 ENCOUNTER — Telehealth: Payer: Self-pay

## 2021-06-04 ENCOUNTER — Ambulatory Visit: Payer: Self-pay | Admitting: Physician Assistant

## 2021-06-04 ENCOUNTER — Other Ambulatory Visit: Payer: Self-pay | Admitting: Physician Assistant

## 2021-06-04 DIAGNOSIS — E876 Hypokalemia: Secondary | ICD-10-CM

## 2021-06-04 DIAGNOSIS — I10 Essential (primary) hypertension: Secondary | ICD-10-CM

## 2021-06-04 NOTE — Progress Notes (Signed)
? ?There were no vitals taken for this visit.  ? ?Subjective:  ? ? Patient ID: Anthony Huff, male    DOB: Jul 13, 1980, 42 y.o.   MRN: [id number] ? ?HPI: ?[name] is a 41 y.o. male presenting on 06/04/2021 for No chief complaint on file. ? ? ?HPI ? ?This is a telemedicine appointment.  It is via telephone as pt was unable to work the technology for appointment with video. ? ?I connected with  [name] on 06/04/21 by a video enabled telemedicine application and verified that I am speaking with the correct person using two identifiers. ?  ?I discussed the limitations of evaluation and management by telemedicine. The patient expressed understanding and agreed to proceed. ? ?Provider is at office. ? ? ? ?Pt is 40yoM with appointment for follow up.  He has been seen by endocrinology for evaluation of possibly hyperaldosteronism vs cushing.  He says he is feeling fine today.  ? ?Pt just started increased potassium tablets; he started 4 d ago taking 4 tablets daily.  He had previously been taking 3 tablets daily.  Most recent K+ was 3.1 on 05/15/21. ? ?Pt says he has been several times for testing and isn't sure if he has appointment tomorrow but feels like the appointment is for the same thing he had done yesterday.   ? ?He has court tomorrow and wouldn't be able to make appointment tomorrow. ? 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 ? ? ? ?Relevant past medical, surgical, family and social history reviewed and updated as indicated. Interim medical history since our last visit reviewed. ?Allergies and medications reviewed and updated. ? ? ? ?Current Outpatient Medications:  ?  magnesium oxide (MAG-OX) 400 MG tablet, Take 400 mg by mouth daily., Disp: , Rfl:  ?  metoprolol succinate (TOPROL-XL) 50 MG 24 hr tablet, Take 1 tablet (50 mg total) by mouth daily. Take with or immediately following a meal., Disp: 30 tablet, Rfl: 11 ?  potassium chloride SA (KLOR-CON M) 20 MEQ tablet, Take 2 tablets (40 mEq total) by  mouth 2 (two) times daily., Disp: 360 tablet, Rfl: 1 ? ? ? ?Review of Systems ? ?Per HPI unless specifically indicated above ? ?   ?Objective:  ?  ?There were no vitals taken for this visit.  ?Wt Readings from Last 3 Encounters:  ?06/03/21 (!) 348 lb 12.8 oz (158.2 kg)  ?05/12/21 (!) 334 lb 9.6 oz (151.8 kg)  ?05/07/21 (!) 347 lb 8 oz (157.6 kg)  ?  ?Physical Exam ?Pulmonary:  ?   Effort: No respiratory distress.  ?   Comments: Pt is talking in complete sentences without dyspnea.   ?Neurological:  ?   Mental Status: He is alert and oriented to person, place, and time.  ?Psychiatric:     ?   Attention and Perception: Attention normal.     ?   Speech: Speech normal.     ?   Behavior: Behavior is cooperative.  ? ? ? ? ? ?   ?Assessment & Plan:  ? ? ?Encounter Diagnoses  ?Name Primary?  ? Primary hypertension Yes  ? Hypokalemia   ? ? ? ?-Check K+ / pt to go to lab for blooddraw one day next week.   He will be called with results ?-attempted to contact Dr. Harvel Ricks assistant about pt testing scheduled for tomorrow.  Was given VM.  Message was left ?-pt to follow up 3 months.  He is to contact office  sooner prn ? ?

## 2021-06-04 NOTE — Telephone Encounter (Signed)
Patient has saline loading at the infusion clinic on [facility] [facility] on 06/03/21. Appointment for 06/05/21 was canceled as it was already completed. Patient notified  ?

## 2021-06-05 ENCOUNTER — Encounter (HOSPITAL_COMMUNITY): Payer: Medicaid Other

## 2021-06-10 LAB — ALDOSTERONE + RENIN ACTIVITY W/ RATIO
ALDO / PRA Ratio: 34.3 Ratio — ABNORMAL HIGH (ref 0.9–28.9)
ALDO / PRA Ratio: 41.7 Ratio — ABNORMAL HIGH (ref 0.9–28.9)
Aldosterone: 12 ng/dL
Aldosterone: 5 ng/dL
Renin Activity: 0.35 ng/mL/h (ref 0.25–5.82)
Renin Activity: 0.12 ng/mL/h — ABNORMAL LOW (ref 0.25–5.82)

## 2021-06-12 ENCOUNTER — Other Ambulatory Visit (HOSPITAL_COMMUNITY)
Admission: RE | Admit: 2021-06-12 | Discharge: 2021-06-12 | Disposition: A | Discharge status: Home / Self Care | Payer: Medicaid Other | Source: Ambulatory Visit | Attending: Physician Assistant | Admitting: Physician Assistant

## 2021-06-12 DIAGNOSIS — E876 Hypokalemia: Secondary | ICD-10-CM | POA: Insufficient documentation

## 2021-06-12 LAB — POTASSIUM: Potassium: 3.4 mmol/L — ABNORMAL LOW (ref 3.5–5.1)

## 2021-06-20 ENCOUNTER — Telehealth: Payer: Self-pay | Admitting: Internal Medicine

## 2021-06-20 DIAGNOSIS — E876 Hypokalemia: Secondary | ICD-10-CM

## 2021-06-20 DIAGNOSIS — E269 Hyperaldosteronism, unspecified: Secondary | ICD-10-CM

## 2021-06-20 NOTE — Telephone Encounter (Signed)
I have discussed saline suppression test with the patient on 06/20/2021 ? ? ?His results are inconclusive as his aldosterone went from 12 to 5 .  I explained to the patient that if his aldosterone level <5 this would exclude hyperaldosteronism or if it was >10 then we will confirm it but he is in between which is inconclusive. ? ? ?I do have high suspicion for hyperaldosteronism given spontaneous hypokalemia and elevated Aldo/PRA ratio ? ? ?We will proceed with CT scan of the abdomen with adrenal protocol ? ? ? ? Latest Reference Range & Units 06/03/21 10:21 06/03/21 15:29  ?ALDOSTERONE  ng/dL 12 5  ?ALDO / PRA Ratio 0.9 - 28.9 Ratio 34.3 (H) 41.7 (H)  ?Renin Activity 0.25 - 5.82 ng/mL/h 0.35 0.12 (L)  ?Patient in agreement ? ? ?Lyndle Herrlich, MD ? ?[facility]  ?[facility] ?[address] ?[place], [place] [address] ?Phone: [phone] ?FAX: [phone] ? ? ?

## 2021-06-20 NOTE — Telephone Encounter (Signed)
-----   Message from Chilton Greathouse, MD sent at 06/15/2021 11:15 AM EDT ----- ?Sending his labs from the infusion center. Am glad we are able to do the aldosterone stim test here. ?  ?----- Message ----- ?From: Interface, [facility] Lab Results In ?Sent: 06/10/2021  12:19 AM EDT ?To: Chilton Greathouse, MD ? ? ?

## 2021-06-23 ENCOUNTER — Telehealth: Payer: Self-pay

## 2021-06-23 NOTE — Telephone Encounter (Signed)
Mother Paiton Ziehm) stop by requesting last AVS be mailed. Inform mother that she is not listed on patients chart and I cannot give her that information. She will have patient contact us.  ?

## 2021-07-24 ENCOUNTER — Telehealth: Payer: Self-pay

## 2021-07-24 NOTE — Telephone Encounter (Signed)
CT order needs to be change to CT W/O Contrast for them to perform imaging  ?

## 2021-07-25 NOTE — Telephone Encounter (Signed)
[facility] states that order is okay and they will be able to proceed ?

## 2021-08-08 ENCOUNTER — Ambulatory Visit (HOSPITAL_BASED_OUTPATIENT_CLINIC_OR_DEPARTMENT_OTHER): Admission: RE | Admit: 2021-08-08 | Payer: Medicaid Other | Source: Ambulatory Visit

## 2021-08-08 ENCOUNTER — Ambulatory Visit (HOSPITAL_BASED_OUTPATIENT_CLINIC_OR_DEPARTMENT_OTHER)
Admission: RE | Admit: 2021-08-08 | Discharge: 2021-08-08 | Disposition: A | Discharge status: Home / Self Care | Payer: Medicaid Other | Source: Ambulatory Visit | Attending: Internal Medicine | Admitting: Internal Medicine

## 2021-08-08 ENCOUNTER — Encounter (HOSPITAL_BASED_OUTPATIENT_CLINIC_OR_DEPARTMENT_OTHER): Payer: Self-pay

## 2021-08-08 ENCOUNTER — Other Ambulatory Visit: Payer: Self-pay | Admitting: Internal Medicine

## 2021-08-08 DIAGNOSIS — E269 Hyperaldosteronism, unspecified: Secondary | ICD-10-CM

## 2021-08-11 ENCOUNTER — Ambulatory Visit (HOSPITAL_BASED_OUTPATIENT_CLINIC_OR_DEPARTMENT_OTHER)
Admission: RE | Admit: 2021-08-11 | Discharge: 2021-08-11 | Disposition: A | Discharge status: Home / Self Care | Payer: Medicaid Other | Source: Ambulatory Visit | Attending: Internal Medicine | Admitting: Internal Medicine

## 2021-08-11 DIAGNOSIS — E269 Hyperaldosteronism, unspecified: Secondary | ICD-10-CM | POA: Insufficient documentation

## 2021-08-11 MED ORDER — IOHEXOL 300 MG/ML  SOLN
100.0000 mL | Freq: Once | INTRAMUSCULAR | Status: AC | PRN
  Administered 2021-08-11: 100 mL via INTRAVENOUS

## 2021-08-12 ENCOUNTER — Ambulatory Visit (INDEPENDENT_AMBULATORY_CARE_PROVIDER_SITE_OTHER): Payer: Medicaid Other | Admitting: Internal Medicine

## 2021-08-12 ENCOUNTER — Encounter: Payer: Self-pay | Admitting: Internal Medicine

## 2021-08-12 VITALS — BP 134/80 | HR 80 | Ht 72.0 in | Wt 346.0 lb

## 2021-08-12 DIAGNOSIS — E269 Hyperaldosteronism, unspecified: Secondary | ICD-10-CM

## 2021-08-12 LAB — BASIC METABOLIC PANEL
BUN: 9 mg/dL (ref 6–23)
CO2: 28 mEq/L (ref 19–32)
Calcium: 9.5 mg/dL (ref 8.4–10.5)
Chloride: 103 mEq/L (ref 96–112)
Creatinine, Ser: 0.64 mg/dL (ref 0.40–1.50)
GFR: 117.31 mL/min (ref >60.00)
Glucose, Bld: 125 mg/dL — ABNORMAL HIGH (ref 70–99)
Potassium: 3.3 mEq/L — ABNORMAL LOW (ref 3.5–5.1)
Sodium: 140 mEq/L (ref 135–145)

## 2021-08-12 MED ORDER — SPIRONOLACTONE 50 MG PO TABS
50.0000 mg | ORAL_TABLET | Freq: Every day | ORAL | 1 refills | Status: DC
Start: 2021-08-12 — End: 2022-02-04

## 2021-08-12 MED ORDER — POTASSIUM CHLORIDE CRYS ER 20 MEQ PO TBCR: 20.0000 meq | EXTENDED_RELEASE_TABLET | Freq: Two times a day (BID) | ORAL | 0 refills | Status: DC

## 2021-08-12 NOTE — Progress Notes (Signed)
? ? ?Name: Anthony Huff  ?MRN/ DOB: [id number], [birth year]    ?Age/ Sex: 41 y.o., male   ? ?PCP: Sharron Petruska, Konrad Dolores, MD   ?Reason for Endocrinology Evaluation: Hypokalemia  ?   ?Date of Initial Endocrinology Evaluation: 08/12/2021   ? ? ?HPI: ?Anthony Huff is a 41 y.o. male with a past medical history of HTN , pre-diabetes and SVT. The patient presented for initial endocrinology clinic visit on 08/12/2021 for consultative assistance with his Hypokalemia .  ? ?Pt has been referred by cardiology for further evaluation of hypokalemia.  ? ? ?In review of his chart, the pt has been noted with multiple episode of hypokalemia since 2022 ? ?He required multiple ED visits for HTN, Hypokalemia, SVT and SOB over the past 2 years.  ? ?No Fh of hypokalemia  ? ? ?24-hour urinary cortisol was normal 04/2021 ?He was noted with p an elevated Aldo/renin ratio at 38.9, aldosterone 8.9 NG/DL and plasma renin activity 0.229 NG/mL/Hr ? ?He is status post saline suppression test on 05/2021 ?His results were inconclusive as his aldosterone went from 12 to 5 after saline infusion.  I did explain to him that if his aldosterone level< 5 this would have excluded hyperaldosteronism , and if it was > 10 then this would have confirmed hyperaldosteronism ? ?CT imaging May/2023 did not reveal any adrenal adenoma ? ? ?  ?  Latest Reference Range & Units 06/03/21 10:21 06/03/21 15:29  ?ALDOSTERONE  ng/dL 12 5  ?ALDO / PRA Ratio 0.9 - 28.9 Ratio 34.3 (H) 41.7 (H)  ?Renin Activity 0.25 - 5.82 ng/mL/h 0.35 0.12 (L)  ? ? ? ?SUBJECTIVE:  ? ? ?Today (08/12/21): Anthony Huff is here to follow-up on hypokalemia. ? ?Pt has been noted with fluctuating weight gain since 2013  ?He denies chronic diarrhea  ?Denies bruising  ?Denies proximal muscle weakness  ?He did have elevated BP in the past  ?Denies muscle cramps  ?Does not eat licorice  ? ? ? ? ?KCl TID  ? ?HISTORY:  ?Past Medical History:  ?Past Medical History:  ?Diagnosis Date  ? COVID-19    ? January 2022  ? SVT (supraventricular tachycardia) (HCC)   ? ?Past Surgical History:  ?Past Surgical History:  ?Procedure Laterality Date  ? WISDOM TOOTH EXTRACTION    ?  ?Social History:  reports that he quit smoking about 2 years ago. His smoking use included cigarettes. He started smoking about 16 years ago. He has never used smokeless tobacco. He reports current drug use. Drug: Marijuana. He reports that he does not drink alcohol. ?Family History: family history includes Cancer in his father; Diabetes in his mother; Heart disease in his mother; Heart failure in his father. ? ? ?HOME MEDICATIONS: ?Allergies as of 08/12/2021   ? ?   Reactions  ? Penicillins Shortness Of Breath  ? ?  ? ?  ?Medication List  ?  ? ?  ? Accurate as of Aug 12, 2021  7:13 AM. If you have any questions, ask your nurse or doctor.  ?  ?  ? ?  ? ?magnesium oxide 400 MG tablet ?Commonly known as: MAG-OX ?Take 400 mg by mouth daily. ?  ?metoprolol succinate 50 MG 24 hr tablet ?Commonly known as: TOPROL-XL ?Take 1 tablet (50 mg total) by mouth daily. Take with or immediately following a meal. ?  ?potassium chloride SA 20 MEQ tablet ?Commonly known as: KLOR-CON M ?Take 2 tablets (40 mEq total) by mouth 2 (  two) times daily. ?  ? ?  ?  ? ? ?REVIEW OF SYSTEMS: ?A comprehensive ROS was conducted with the patient and is negative except as per HPI  ? ? ?OBJECTIVE:  ?VS:BP 134/80 (BP Location: Left Arm, Patient Position: Sitting, Cuff Size: Large)   Pulse 80   Ht 6' (1.829 m)   Wt (!) 346 lb (156.9 kg)   SpO2 99%   BMI 46.93 kg/m?  ? ? ? ?Wt Readings from Last 3 Encounters:  ?06/03/21 (!) 348 lb 12.8 oz (158.2 kg)  ?05/12/21 (!) 334 lb 9.6 oz (151.8 kg)  ?05/07/21 (!) 347 lb 8 oz (157.6 kg)  ? ? ? ?EXAM: ?General: Pt appears well and is in NAD  ?Neck: General: Supple without adenopathy. ?Thyroid: Thyroid size normal.  No goiter or nodules appreciated.   ?Lungs: Clear with good BS bilat with no rales, rhonchi, or wheezes  ?Heart: Auscultation:  RRR.  ?Abdomen: Normoactive bowel sounds, soft, nontender, without masses or organomegaly palpable  ?Extremities:  ?BL LE: No pretibial edema normal ROM and strength.  ?Neuro:  ?DTRs: 2+ and symmetric in UE without delay in relaxation phase  ?Mental Status: Judgment, insight: Intact ?Orientation: Oriented to time, place, and perso ?Mood and affect: No depression, anxiety, or agitation  ? ? ? ?DATA REVIEWED: ? Latest Reference Range & Units 04/18/21 09:10  ?Sodium 135 - 145 mmol/L 137  ?Potassium 3.5 - 5.1 mmol/L 3.8  ?Chloride 98 - 111 mmol/L 104  ?CO2 22 - 32 mmol/L 26  ?Glucose 70 - 99 mg/dL 409114 (H)  ?Mean Plasma Glucose mg/dL 811120  ?BUN 6 - 20 mg/dL 11  ?Creatinine 0.61 - 1.24 mg/dL 9.140.65  ?Calcium 8.9 - 10.3 mg/dL 8.7 (L)  ?Anion gap 5 - 15  7  ?Alkaline Phosphatase 38 - 126 U/L 58  ?Albumin 3.5 - 5.0 g/dL 4.2  ?AST 15 - 41 U/L 20  ?ALT 0 - 44 U/L 22  ?Total Protein 6.5 - 8.1 g/dL 7.3  ?Total Bilirubin 0.3 - 1.2 mg/dL 1.3 (H)  ?GFR, Estimated >60 mL/min >60  ? ?  ? Latest Reference Range & Units 04/18/21 09:10  ?Total CHOL/HDL Ratio RATIO 2.4  ?Cholesterol 0 - 200 mg/dL 81  ?HDL Cholesterol >40 mg/dL 34 (L)  ?LDL (calc) 0 - 99 mg/dL 28  ?Triglycerides <150 mg/dL 95  ?VLDL 0 - 40 mg/dL 19  ? ?CT abdomen 08/11/2021 ? ? ?FINDINGS: ?Lower chest: No acute findings. ?  ?Hepatobiliary: No hepatic masses identified. Moderate diffuse ?hepatic steatosis. Gallbladder is unremarkable. No evidence of ?biliary ductal dilatation. ?  ?Pancreas:  No mass or inflammatory changes. ?  ?Spleen:  Within normal limits in size and appearance. ?  ?Adrenals/Urinary Tract: No evidence of adrenal mass. Several tiny ?benign-appearing renal cysts are seen bilaterally (no followup ?imaging is recommended). No evidence of renal mass or ?hydronephrosis. ?  ?Stomach/Bowel: Unremarkable. ?  ?Vascular/Lymphatic: No pathologically enlarged lymph nodes ?identified. No acute vascular findings. ?  ?Other:  None. ?  ?Musculoskeletal:  No suspicious bone  lesions identified. ?  ?IMPRESSION: ?No evidence of adrenal mass or other acute findings. ?  ?Moderate hepatic steatosis. ?  ? ?ASSESSMENT/PLAN/RECOMMENDATIONS:  ? ?Primary hyperaldosteronism: ? ? ? ?-High suspicion for hyperaldosteronism ?-Saline suppression test came back inconclusive we will aldosterone decreasing from 12 to 5.  ?-CT scan did not show any adrenal adenoma ?-I have recommended medical management with spironolactone, discussed the risk of dehydration and gynecomastia (none at this time) ?-In the meantime we will  reduce potassium ?-He was given a lab order through [facility] for repeat BMP in 4 weeks ? ?Medication  ?Start spironolactone 50 mg daily ?Decrease Kcl 20 mEq to twice daily ? ? ?F/U in 3 months  ? ?Signed electronically by: ?Abby Raelyn Mora, MD ? ?[facility]  ?[facility] ?[address] ?[place], [place] [address] ?Phone: [phone] ?FAX: [phone] ? ? ?CC: ?Kentrel Clevenger, Konrad Dolores, MD ?[address] [place] [address] ?[place] [address] ?Phone: [phone] ?Fax: [phone] ? ? ?Return to Endocrinology clinic as below: ?Future Appointments  ?Date Time Provider Department Center  ?08/12/2021 10:30 AM Jansen Goodpasture, Konrad Dolores, MD [facility] None  ?  ? ? ? ? ? ?

## 2021-08-12 NOTE — Patient Instructions (Addendum)
-   Decrease Potassium to two tablets a day  ?- Start Spironolactone 50 mg daily  ? ?- Have labs done in 4 weeks ( Order attached )  ?

## 2021-09-08 ENCOUNTER — Ambulatory Visit: Payer: Self-pay | Admitting: Physician Assistant

## 2021-10-30 ENCOUNTER — Other Ambulatory Visit: Payer: Self-pay | Admitting: Cardiology

## 2021-12-03 ENCOUNTER — Other Ambulatory Visit: Payer: Self-pay | Admitting: Cardiology

## 2021-12-22 ENCOUNTER — Other Ambulatory Visit: Payer: Self-pay | Admitting: Cardiology

## 2022-01-29 NOTE — Progress Notes (Deleted)
Cardiology Office Note:    Date:  01/29/2022   ID:  Anthony Huff, DOB [birth year], MRN [id number]  PCP:  Kelton Pillar, Melanie Crazier, MD   [facility] Providers Cardiologist:  Rozann Lesches, MD { Click to update primary MD,subspecialty MD or APP then REFRESH:1}    Referring MD: Nanci Pina*   Chief Complaint: ***  History of Present Illness:    Anthony Huff is a *** 41 y.o. male with a hx of PSVT, HTN, former tobacco use, and morbid obesity.   He was referred to cardiology and seen by Dr. Domenic Polite on 10/08/2020 after ER visit in May 2022 with palpitations and documented SVT.  Records indicate hypokalemia on presentation, SVT treated with adenosine and IV Lopressor.  He was discharged on Lopressor 25 mg twice daily, potassium and magnesium supplement.  EKG tracings were reviewed by Dr. Domenic Polite and revealed sinus tachycardia with ventricular couplet and otherwise normal intervals.  Subsequent tracing showed SVT at 169 bpm.  At the office visit he was taking Lopressor once daily to preserve the pills since he did not have a follow-up visit scheduled.  He was no longer taking potassium or magnesium.  He reported a general sense of palpitations as frequently as once a week, better when he takes Lopressor. He was advised to switch to long-acting metoprolol succinate 50 mg daily.   Lab work revealed potassium 2.9.  He was not on any specific therapy that should reduce potassium level and was referred to nephrology for further work-up of hypokalemia, mainly to exclude adrenal disease or renal tubular acidosis.  2D echo 10/21/2020 revealed normal LVEF 60 to 65%, no RWMA, mild LVH, normal diastolic parameters, normal RV, no significant valve disease. Was advised to follow-up in 3 months.  Today, he is here  Recent notes from PCP reveal inconclusive testing but high suspicion for hyperaldosteronism.  No adrenal adenoma by CT. placed on spironolactone 50 mg daily and potassium  supplement was decreased to 20 mEq twice daily.  Past Medical History:  Diagnosis Date   COVID-17 April 2020   SVT (supraventricular tachycardia) [facility])     Past Surgical History:  Procedure Laterality Date   WISDOM TOOTH EXTRACTION      Current Medications: No outpatient medications have been marked as taking for the 01/30/22 encounter (Appointment) with Ann Maki, Lanice Schwab, NP.     Allergies:   Penicillins   Social History   Socioeconomic History   Marital status: Single    Spouse name: Not on file   Number of children: Not on file   Years of education: Not on file   Highest education level: Not on file  Occupational History   Not on file  Tobacco Use   Smoking status: Former    Types: Cigarettes    Start date: 03/31/2005    Quit date: 03/31/2019    Years since quitting: 2.8   Smokeless tobacco: Never  Vaping Use   Vaping Use: Never used  Substance and Sexual Activity   Alcohol use: No   Drug use: Yes    Types: Marijuana    Comment: 5 days/month   Sexual activity: Yes  Other Topics Concern   Not on file  Social History Narrative   Not on file   Social Determinants of Health   Financial Resource Strain: Not on file  Food Insecurity: Not on file  Transportation Needs: Not on file  Physical Activity: Not on file  Stress: Not on file  Social Connections: Not on file     Family History: The patient's ***family history includes Cancer in his father; Diabetes in his mother; Heart disease in his mother; Heart failure in his father.  ROS:   Please see the history of present illness.    *** All other systems reviewed and are negative.  Labs/Other Studies Reviewed:    The following studies were reviewed today:  Echo 10/21/20  1. Left ventricular ejection fraction, by estimation, is 60 to 65%. The  left ventricle has normal function. The left ventricle has no regional  wall motion abnormalities. The left ventricular internal cavity size was  moderately  dilated. There is mild  left ventricular hypertrophy. Left ventricular diastolic parameters were  normal.   2. Right ventricular systolic function is normal. The right ventricular  size is normal.   3. The mitral valve is normal in structure. Trivial mitral valve  regurgitation.   4. The aortic valve is tricuspid. Aortic valve regurgitation is not  visualized.   5. The inferior vena cava is normal in size with greater than 50%  respiratory variability, suggesting right atrial pressure of 3 mmHg.   Recent Labs: 03/13/2021: Hemoglobin 15.0; Platelets 277 04/18/2021: ALT 22 05/15/2021: Magnesium 1.8 08/12/2021: BUN 9; Creatinine, Ser 0.64; Potassium 3.3; Sodium 140  Recent Lipid Panel    Component Value Date/Time   CHOL 81 04/18/2021 0910   TRIG 95 04/18/2021 0910   HDL 34 (L) 04/18/2021 0910   CHOLHDL 2.4 04/18/2021 0910   VLDL 19 04/18/2021 0910   LDLCALC 28 04/18/2021 0910     Risk Assessment/Calculations:   {Does this patient have ATRIAL FIBRILLATION?:[phone]}       Physical Exam:    VS:  There were no vitals taken for this visit.    Wt Readings from Last 3 Encounters:  08/12/21 (!) 346 lb (156.9 kg)  06/03/21 (!) 348 lb 12.8 oz (158.2 kg)  05/12/21 (!) 334 lb 9.6 oz (151.8 kg)     GEN: *** Well nourished, well developed in no acute distress HEENT: Normal NECK: No JVD; No carotid bruits CARDIAC: ***RRR, no murmurs, rubs, gallops RESPIRATORY:  Clear to auscultation without rales, wheezing or rhonchi  ABDOMEN: Soft, non-tender, non-distended MUSCULOSKELETAL:  No edema; No deformity. *** pedal pulses, ***bilaterally SKIN: Warm and dry NEUROLOGIC:  Alert and oriented x 3 PSYCHIATRIC:  Normal affect   EKG:  EKG is *** ordered today.  The ekg ordered today demonstrates ***  No BP recorded.  {Refresh Note OR Click here to enter BP  :1}***    Diagnoses:    No diagnosis found. Assessment and Plan:     PSVT: Hypertension: Hyperaldosteronism:  {Are you  ordering a CV Procedure (e.g. stress test, cath, DCCV, TEE, etc)?   Press F2        :[id number]}   Disposition:  Medication Adjustments/Labs and Tests Ordered: Current medicines are reviewed at length with the patient today.  Concerns regarding medicines are outlined above.  No orders of the defined types were placed in this encounter.  No orders of the defined types were placed in this encounter.   There are no Patient Instructions on file for this visit.   Signed, Levi Aland, NP  01/29/2022 3:19 PM    [facility]

## 2022-01-30 ENCOUNTER — Ambulatory Visit: Payer: Medicaid Other | Attending: Nurse Practitioner | Admitting: Nurse Practitioner

## 2022-02-04 ENCOUNTER — Other Ambulatory Visit: Payer: Self-pay | Admitting: Internal Medicine

## 2022-02-11 ENCOUNTER — Ambulatory Visit: Payer: Medicaid Other | Admitting: Internal Medicine

## 2022-02-11 NOTE — Progress Notes (Deleted)
Name: Anthony Huff  MRN/ DOB: [id number], [birth year]    Age/ Sex: 41 y.o., male    PCP: Avyonna Wagoner, Konrad Dolores, MD   Reason for Endocrinology Evaluation: Hypokalemia     Date of Initial Endocrinology Evaluation: 02/11/2022     HPI: [name] is a 41 y.o. male with a past medical history of HTN , pre-diabetes and SVT. The patient presented for initial endocrinology clinic visit on 02/11/2022 for consultative assistance with his Hypokalemia .   Pt has been referred by cardiology for further evaluation of hypokalemia.    In review of his chart, the pt has been noted with multiple episode of hypokalemia since 2022  He required multiple ED visits for HTN, Hypokalemia, SVT and SOB over the past 2 years.   No Fh of hypokalemia    24-hour urinary cortisol was normal 04/2021 He was noted with p an elevated Aldo/renin ratio at 38.9, aldosterone 8.9 NG/DL and plasma renin activity 0.229 NG/mL/Hr  He is status post saline suppression test on 05/2021 His results were inconclusive as his aldosterone went from 12 to 5 after saline infusion.  I did explain to him that if his aldosterone level< 5 this would have excluded hyperaldosteronism , and if it was > 10 then this would have confirmed hyperaldosteronism  CT imaging May/2023 did not reveal any adrenal adenoma       Latest Reference Range & Units 06/03/21 10:21 06/03/21 15:29  ALDOSTERONE  ng/dL 12 5  ALDO / PRA Ratio 0.9 - 28.9 Ratio 34.3 (H) 41.7 (H)  Renin Activity 0.25 - 5.82 ng/mL/h 0.35 0.12 (L)     SUBJECTIVE:    Today (02/11/22): Mr. Privitera is here to follow-up on hypokalemia.  Pt has been noted with fluctuating weight gain since 2013  He denies chronic diarrhea  Denies bruising  Denies proximal muscle weakness  He did have elevated BP in the past  Denies muscle cramps  Does not eat licorice     Spironolactone 50 mg daily KCl BID   HISTORY:  Past Medical History:  Past Medical History:   Diagnosis Date   COVID-17 April 2020   SVT (supraventricular tachycardia) (HCC)    Past Surgical History:  Past Surgical History:  Procedure Laterality Date   WISDOM TOOTH EXTRACTION      Social History:  reports that he quit smoking about 2 years ago. His smoking use included cigarettes. He started smoking about 16 years ago. He has never used smokeless tobacco. He reports current drug use. Drug: Marijuana. He reports that he does not drink alcohol. Family History: family history includes Cancer in his father; Diabetes in his mother; Heart disease in his mother; Heart failure in his father.   HOME MEDICATIONS: Allergies as of 02/11/2022       Reactions   Penicillins Shortness Of Breath        Medication List        Accurate as of February 11, 2022  7:17 AM. If you have any questions, ask your nurse or doctor.          magnesium oxide 400 MG tablet Commonly known as: MAG-OX Take 400 mg by mouth daily.   metoprolol succinate 50 MG 24 hr tablet Commonly known as: TOPROL-XL TAKE 1 TABLET(50 MG) BY MOUTH DAILY WITH OR IMMEDIATELY FOLLOWING A MEAL   potassium chloride SA 20 MEQ tablet Commonly known as: KLOR-CON M Take 1 tablet (20 mEq total) by mouth 2 (  two) times daily.   spironolactone 50 MG tablet Commonly known as: ALDACTONE TAKE 1 TABLET(50 MG) BY MOUTH DAILY          REVIEW OF SYSTEMS: A comprehensive ROS was conducted with the patient and is negative except as per HPI    OBJECTIVE:  PX:TGGYI were no vitals taken for this visit.    Wt Readings from Last 3 Encounters:  08/12/21 (!) 346 lb (156.9 kg)  06/03/21 (!) 348 lb 12.8 oz (158.2 kg)  05/12/21 (!) 334 lb 9.6 oz (151.8 kg)     EXAM: General: Pt appears well and is in NAD  Neck: General: Supple without adenopathy. Thyroid: Thyroid size normal.  No goiter or nodules appreciated.   Lungs: Clear with good BS bilat with no rales, rhonchi, or wheezes  Heart: Auscultation: RRR.   Abdomen: Normoactive bowel sounds, soft, nontender, without masses or organomegaly palpable  Extremities:  BL LE: No pretibial edema normal ROM and strength.  Neuro:  DTRs: 2+ and symmetric in UE without delay in relaxation phase  Mental Status: Judgment, insight: Intact Orientation: Oriented to time, place, and perso Mood and affect: No depression, anxiety, or agitation     DATA REVIEWED:  Latest Reference Range & Units 04/18/21 09:10  Sodium 135 - 145 mmol/L 137  Potassium 3.5 - 5.1 mmol/L 3.8  Chloride 98 - 111 mmol/L 104  CO2 22 - 32 mmol/L 26  Glucose 70 - 99 mg/dL 948 (H)  Mean Plasma Glucose mg/dL 546  BUN 6 - 20 mg/dL 11  Creatinine 2.70 - 3.50 mg/dL 0.93  Calcium 8.9 - 81.8 mg/dL 8.7 (L)  Anion gap 5 - 15  7  Alkaline Phosphatase 38 - 126 U/L 58  Albumin 3.5 - 5.0 g/dL 4.2  AST 15 - 41 U/L 20  ALT 0 - 44 U/L 22  Total Protein 6.5 - 8.1 g/dL 7.3  Total Bilirubin 0.3 - 1.2 mg/dL 1.3 (H)  GFR, Estimated >60 mL/min >60      Latest Reference Range & Units 04/18/21 09:10  Total CHOL/HDL Ratio RATIO 2.4  Cholesterol 0 - 200 mg/dL 81  HDL Cholesterol >29 mg/dL 34 (L)  LDL (calc) 0 - 99 mg/dL 28  Triglycerides <937 mg/dL 95  VLDL 0 - 40 mg/dL 19   CT abdomen 1/69/6789   FINDINGS: Lower chest: No acute findings.   Hepatobiliary: No hepatic masses identified. Moderate diffuse hepatic steatosis. Gallbladder is unremarkable. No evidence of biliary ductal dilatation.   Pancreas:  No mass or inflammatory changes.   Spleen:  Within normal limits in size and appearance.   Adrenals/Urinary Tract: No evidence of adrenal mass. Several tiny benign-appearing renal cysts are seen bilaterally (no followup imaging is recommended). No evidence of renal mass or hydronephrosis.   Stomach/Bowel: Unremarkable.   Vascular/Lymphatic: No pathologically enlarged lymph nodes identified. No acute vascular findings.   Other:  None.   Musculoskeletal:  No suspicious bone  lesions identified.   IMPRESSION: No evidence of adrenal mass or other acute findings.   Moderate hepatic steatosis.    ASSESSMENT/PLAN/RECOMMENDATIONS:   Primary hyperaldosteronism:    -High suspicion for hyperaldosteronism -Saline suppression test came back inconclusive with  aldosterone decreasing from 12 to 5.  -CT scan did not show any adrenal adenoma -I have recommended medical management with spironolactone, discussed the risk of dehydration and gynecomastia (none at this time) -In the meantime we will reduce potassium -He was given a lab order through [facility] for repeat BMP in 4 weeks  Medication  Start spironolactone 50 mg daily Decrease Kcl 20 mEq to twice daily   F/U in 3 months   Signed electronically by: Lyndle Herrlich, MD  [facility]  [facility] [address] [place], [place] [address] Phone: [phone] FAX: [phone]   CC: Pyper Olexa, Konrad Dolores, MD [address] [place] [address] Phone: [phone] Fax: [phone]   Return to Endocrinology clinic as below: Future Appointments  Date Time Provider Department Center  02/11/2022  9:10 AM Nija Koopman, Konrad Dolores, MD [facility] None  03/02/2022 12:30 PM Dyann Kief, PA-C [facility]

## 2022-02-16 NOTE — Progress Notes (Signed)
Cardiology Office Note:    Date:  03/02/2022   ID:  [name], DOB Sep 16, 1980, MRN [id number]  PCP:  Lonzo Cloud Konrad Dolores, MD  [facility] Providers Cardiologist:  Nona Dell, MD     Referring MD: Levonne Lapping, NP   Chief Complaint:  Follow-up     History of Present Illness:   Anthony Huff is a 41 y.o. male with history of HTN, SVT in the setting of hypokalemia treated with adenosine and IV metoprolol 07/2020. Last seen by Dr. Diona Browner 10/08/20 and stable.       Patient comes in for f/u. He only feels his heart racing if he runs out of his medication-usually every month. Has had trouble with K dropping so started on spironolactone by endocrinology. Suspect hyperaldosteronism. Missed his last appt. Not regular exercise. Has an appt with a weight loss clinic next week.     Past Medical History:  Diagnosis Date   COVID-17 April 2020   SVT (supraventricular tachycardia)    Current Medications: Current Meds  Medication Sig   magnesium oxide (MAG-OX) 400 MG tablet Take 400 mg by mouth daily.   potassium chloride SA (KLOR-CON M) 20 MEQ tablet Take 1 tablet (20 mEq total) by mouth 2 (two) times daily.   spironolactone (ALDACTONE) 50 MG tablet TAKE 1 TABLET(50 MG) BY MOUTH DAILY   [DISCONTINUED] metoprolol succinate (TOPROL-XL) 50 MG 24 hr tablet TAKE 1 TABLET(50 MG) BY MOUTH DAILY WITH OR IMMEDIATELY FOLLOWING A MEAL    Allergies:   Penicillins   Social History   Tobacco Use   Smoking status: Former    Types: Cigarettes    Start date: 03/31/2005    Quit date: 03/31/2019    Years since quitting: 2.9   Smokeless tobacco: Never  Vaping Use   Vaping Use: Never used  Substance Use Topics   Alcohol use: No   Drug use: Yes    Types: Marijuana    Comment: 5 days/month    Family Hx: The patient's family history includes Cancer in his father; Diabetes in his mother; Heart disease in his mother; Heart failure in his father.  ROS     Physical Exam:     VS:  BP 130/80   Pulse 78   Ht 6' (1.829 m)   Wt (!) 355 lb 12.8 oz (161.4 kg)   SpO2 97%   BMI 48.26 kg/m     Wt Readings from Last 3 Encounters:  03/02/22 (!) 355 lb 12.8 oz (161.4 kg)  08/12/21 (!) 346 lb (156.9 kg)  06/03/21 (!) 348 lb 12.8 oz (158.2 kg)    Physical Exam  GEN: Obese, in no acute distress  Neck: no JVD, carotid bruits, or masses Cardiac:RRR; no murmurs, rubs, or gallops  Respiratory:  clear to auscultation bilaterally, normal work of breathing GI: soft, nontender, nondistended, + BS Ext: without cyanosis, clubbing, or edema, Good distal pulses bilaterally Neuro:  Alert and Oriented x 3,  Psych: euthymic mood, full affect        EKGs/Labs/Other Test Reviewed:    EKG:  EKG is   ordered today.  The ekg ordered today demonstrates NSR normal EKG  Recent Labs: 03/13/2021: Hemoglobin 15.0; Platelets 277 04/18/2021: ALT 22 05/15/2021: Magnesium 1.8 08/12/2021: BUN 9; Creatinine, Ser 0.64; Potassium 3.3; Sodium 140   Recent Lipid Panel Recent Labs    04/18/21 0910  CHOL 81  TRIG 95  HDL 34*  VLDL 19  LDLCALC 28  Prior CV Studies:   Echo 10/21/20 IMPRESSIONS     1. Left ventricular ejection fraction, by estimation, is 60 to 65%. The  left ventricle has normal function. The left ventricle has no regional  wall motion abnormalities. The left ventricular internal cavity size was  moderately dilated. There is mild  left ventricular hypertrophy. Left ventricular diastolic parameters were  normal.   2. Right ventricular systolic function is normal. The right ventricular  size is normal.   3. The mitral valve is normal in structure. Trivial mitral valve  regurgitation.   4. The aortic valve is tricuspid. Aortic valve regurgitation is not  visualized.   5. The inferior vena cava is normal in size with greater than 50%  respiratory variability, suggesting right atrial pressure of 3 mmHg.     Risk Assessment/Calculations/Metrics:               ASSESSMENT & PLAN:   No problem-specific Assessment & Plan notes found for this encounter.   SVT in the setting of hypokalemia currently on Toprol XL 50 mg daily. Echo 09/2020 normal LVEF. No recurrence. Refill meds. Labs reviewed in [facility] and all stable including K, Mg, lipid panel, cmet, cbc and TSH  HTN controlled on metoprolol and sprionolactone  Hyperaldosteronism managed with spironolactone by endocrine  Obesity-exercise and weight loss discussed-has an appt at weight loss clinic next week.           Dispo:  No follow-ups on file.   Medication Adjustments/Labs and Tests Ordered: Current medicines are reviewed at length with the patient today.  Concerns regarding medicines are outlined above.  Tests Ordered: No orders of the defined types were placed in this encounter.  Medication Changes: Meds ordered this encounter  Medications   metoprolol succinate (TOPROL-XL) 50 MG 24 hr tablet    Sig: TAKE 1 TABLET(50 MG) BY MOUTH DAILY WITH OR IMMEDIATELY FOLLOWING A MEAL    Dispense:  90 tablet    Refill:  3    NEEDS Apt for refill-No showed last apt last year   Signed, Jacolyn Reedy, PA-C  03/02/2022 1:13 PM    [facility] [address] [place], [place], [place]  [address] Phone: [phone]; Fax: [phone]

## 2022-03-02 ENCOUNTER — Ambulatory Visit: Payer: Medicaid Other | Attending: Physician Assistant | Admitting: Physician Assistant

## 2022-03-02 ENCOUNTER — Encounter: Payer: Self-pay | Admitting: Physician Assistant

## 2022-03-02 VITALS — BP 130/80 | HR 78 | Ht 72.0 in | Wt 355.8 lb

## 2022-03-02 DIAGNOSIS — I471 Supraventricular tachycardia, unspecified: Secondary | ICD-10-CM | POA: Diagnosis not present

## 2022-03-02 DIAGNOSIS — E269 Hyperaldosteronism, unspecified: Secondary | ICD-10-CM

## 2022-03-02 DIAGNOSIS — I10 Essential (primary) hypertension: Secondary | ICD-10-CM

## 2022-03-02 MED ORDER — METOPROLOL SUCCINATE ER 50 MG PO TB24: ORAL_TABLET | ORAL | 3 refills | Status: DC

## 2022-03-02 NOTE — Addendum Note (Signed)
Addended by: Kerney Elbe on: 03/02/2022 04:08 PM   Modules accepted: Orders

## 2022-03-02 NOTE — Patient Instructions (Signed)
Medication Instructions:  Your physician recommends that you continue on your current medications as directed. Please refer to the Current Medication list given to you today.  *If you need a refill on your cardiac medications before your next appointment, please call your pharmacy*   Lab Work: NONE   If you have labs (blood work) drawn today and your tests are completely normal, you will receive your results only by: MyChart Message (if you have MyChart) OR A paper copy in the mail If you have any lab test that is abnormal or we need to change your treatment, we will call you to review the results.   Testing/Procedures: NONE    Follow-Up: At [facility], you and your health needs are our priority.  As part of our continuing mission to provide you with exceptional heart care, we have created designated Provider Care Teams.  These Care Teams include your primary Cardiologist (physician) and Advanced Practice Providers (APPs -  Physician Assistants and Nurse Practitioners) who all work together to provide you with the care you need, when you need it.  We recommend signing up for the patient portal called "MyChart".  Sign up information is provided on this After Visit Summary.  MyChart is used to connect with patients for Virtual Visits (Telemedicine).  Patients are able to view lab/test results, encounter notes, upcoming appointments, etc.  Non-urgent messages can be sent to your provider as well.   To learn more about what you can do with MyChart, go to [email or web address].    Your next appointment:   1 year(s)  The format for your next appointment:   In Person  Provider:   Nona Dell, MD    Other Instructions Please complete 150 mins exercise per week    Important Information About Sugar

## 2022-04-20 ENCOUNTER — Emergency Department (HOSPITAL_COMMUNITY): Payer: Medicaid Other

## 2022-04-20 ENCOUNTER — Emergency Department (HOSPITAL_COMMUNITY)
Admission: EM | Admit: 2022-04-20 | Discharge: 2022-04-20 | Disposition: A | Discharge status: Home / Self Care | Payer: Medicaid Other | Attending: Emergency Medicine | Admitting: Emergency Medicine

## 2022-04-20 ENCOUNTER — Encounter (HOSPITAL_COMMUNITY): Payer: Self-pay | Admitting: Emergency Medicine

## 2022-04-20 ENCOUNTER — Other Ambulatory Visit: Payer: Self-pay

## 2022-04-20 DIAGNOSIS — E876 Hypokalemia: Secondary | ICD-10-CM | POA: Diagnosis not present

## 2022-04-20 DIAGNOSIS — R1110 Vomiting, unspecified: Secondary | ICD-10-CM

## 2022-04-20 DIAGNOSIS — I471 Supraventricular tachycardia, unspecified: Secondary | ICD-10-CM | POA: Diagnosis not present

## 2022-04-20 DIAGNOSIS — R000 Tachycardia, unspecified: Secondary | ICD-10-CM

## 2022-04-20 DIAGNOSIS — K529 Noninfective gastroenteritis and colitis, unspecified: Secondary | ICD-10-CM | POA: Insufficient documentation

## 2022-04-20 LAB — CBC
HCT: 47.5 % (ref 39.0–52.0)
Hemoglobin: 16.1 g/dL (ref 13.0–17.0)
MCH: 29.9 pg (ref 26.0–34.0)
MCHC: 33.9 g/dL (ref 30.0–36.0)
MCV: 88.3 fL (ref 80.0–100.0)
Platelets: 251 10*3/uL (ref 150–400)
RBC: 5.38 MIL/uL (ref 4.22–5.81)
RDW: 12.9 % (ref 11.5–15.5)
WBC: 10.1 10*3/uL (ref 4.0–10.5)
nRBC: 0 % (ref 0.0–0.2)

## 2022-04-20 LAB — BASIC METABOLIC PANEL
Anion gap: 11 (ref 5–15)
BUN: 9 mg/dL (ref 6–20)
CO2: 23 mmol/L (ref 22–32)
Calcium: 9.2 mg/dL (ref 8.9–10.3)
Chloride: 103 mmol/L (ref 98–111)
Creatinine, Ser: 0.63 mg/dL (ref 0.61–1.24)
GFR, Estimated: >60 mL/min (ref >60)
Glucose, Bld: 120 mg/dL — ABNORMAL HIGH (ref 70–99)
Potassium: 3.2 mmol/L — ABNORMAL LOW (ref 3.5–5.1)
Sodium: 137 mmol/L (ref 135–145)

## 2022-04-20 LAB — MAGNESIUM: Magnesium: 2 mg/dL (ref 1.7–2.4)

## 2022-04-20 LAB — TROPONIN I (HIGH SENSITIVITY)
Troponin I (High Sensitivity): 5 ng/L (ref <18)
Troponin I (High Sensitivity): 7 ng/L (ref <18)

## 2022-04-20 MED ORDER — LOPERAMIDE HCL 2 MG PO CAPS
4.0000 mg | ORAL_CAPSULE | Freq: Once | ORAL | Status: AC
  Administered 2022-04-20: 4 mg via ORAL
  Filled 2022-04-20: qty 2

## 2022-04-20 MED ORDER — SODIUM CHLORIDE 0.9 % IV BOLUS
1000.0000 mL | Freq: Once | INTRAVENOUS | Status: AC
  Administered 2022-04-20: 1000 mL via INTRAVENOUS

## 2022-04-20 MED ORDER — POTASSIUM CHLORIDE CRYS ER 20 MEQ PO TBCR
40.0000 meq | EXTENDED_RELEASE_TABLET | Freq: Once | ORAL | Status: AC
Start: 2022-04-20 — End: 2022-04-20
  Administered 2022-04-20: 40 meq via ORAL
  Filled 2022-04-20: qty 2

## 2022-04-20 MED ORDER — ONDANSETRON 4 MG PO TBDP: 4.0000 mg | ORAL_TABLET | Freq: Three times a day (TID) | ORAL | 0 refills | Status: DC | PRN

## 2022-04-20 NOTE — Discharge Instructions (Addendum)
You were seen in the emergency department today for tachycardia, vomiting, and diarrhea. Given your history of SVT, chest xray, EKG and cardiac enzymes were ordered to determine potential cause of symptoms with all labs showing reassuring results. Without your symptoms of vomiting and diarrhea, it is most likely that your heart rate is high due to some level of dehydration. Manage the vomiting and diarrhea at home with fluids as tolerated as well as Imodium for diarrhea and Zofran for nausea. Plan on following up with your cardiologist to discuss potentially starting new/additional medications to get better control of your heart rate.

## 2022-04-20 NOTE — ED Provider Notes (Signed)
[facility]ERGENCY DEPARTMENT AT [facility] Provider Note   CSN: [id number] Arrival date & time: 04/20/22  1122     History Chief Complaint  Patient presents with   Tachycardia    Anthony Huff is a 42 y.o. male.  HPI Patient presents to the emergency department with complaints of tachycardia that began earlier this morning.  History of SVT, hypokalemia, hypomagnesemia. Patient reports that he previously had episodes of tachycardia which have been due to altered potassium levels.  Patient did physical dose of metoprolol 50 mg earlier today which was able to bring down heart rate to around 100 bpm.  Patient denies shortness of breath, chest pain, wheezing, cough, fever, abdominal pain, lower leg swelling.  Also reports a history of hypomagnesemia but he is not taking magnesium supplements in the last 2 to 3 months.  Patient also endorses associated diarrhea and vomiting.    Home Medications Prior to Admission medications   Medication Sig Start Date End Date Taking? Authorizing Provider  magnesium oxide (MAG-OX) 400 MG tablet Take 400 mg by mouth daily.   Yes [provider]  metoprolol succinate (TOPROL-XL) 50 MG 24 hr tablet TAKE 1 TABLET(50 MG) BY MOUTH DAILY WITH OR IMMEDIATELY FOLLOWING A MEAL 03/02/22  Yes Dyann Kief, PA-C  ondansetron (ZOFRAN-ODT) 4 MG disintegrating tablet Take 1 tablet (4 mg total) by mouth every 8 (eight) hours as needed for nausea or vomiting. 04/20/22  Yes Maryanna Shape A, PA-C  potassium chloride SA (KLOR-CON M) 20 MEQ tablet Take 1 tablet (20 mEq total) by mouth 2 (two) times daily. 08/12/21  Yes Shamleffer, Konrad Dolores, MD  spironolactone (ALDACTONE) 50 MG tablet TAKE 1 TABLET(50 MG) BY MOUTH DAILY 02/04/22  Yes Shamleffer, Konrad Dolores, MD      Allergies    Penicillins    Review of Systems   Review of Systems  Constitutional:  Positive for appetite change. Negative for chills, fatigue and fever.  Respiratory:   Negative for cough, chest tightness and shortness of breath.   Cardiovascular:  Negative for chest pain.  Gastrointestinal:  Positive for diarrhea, nausea and vomiting. Negative for abdominal pain.  Genitourinary:  Negative for dysuria.  Neurological:  Negative for numbness and headaches.  All other systems reviewed and are negative.   Physical Exam Updated Vital Signs BP (!) 145/90   Pulse (!) 105   Temp 98.4 F (36.9 C) (Oral)   Resp 16   Ht 6' (1.829 m)   Wt (!) 156.5 kg   SpO2 97%   BMI 46.79 kg/m  Physical Exam Vitals and nursing note reviewed.  Constitutional:      Appearance: Normal appearance.  HENT:     Head: Normocephalic and atraumatic.  Eyes:     Conjunctiva/sclera: Conjunctivae normal.  Cardiovascular:     Rate and Rhythm: Regular rhythm. Tachycardia present.     Pulses: Normal pulses.     Heart sounds: Normal heart sounds. No murmur heard. Pulmonary:     Effort: Pulmonary effort is normal. No respiratory distress.     Breath sounds: Normal breath sounds. No wheezing.  Abdominal:     General: Abdomen is flat. Bowel sounds are normal.  Musculoskeletal:     Right lower leg: No edema.     Left lower leg: No edema.  Skin:    General: Skin is warm and dry.     Capillary Refill: Capillary refill takes less than 2 seconds.     Coloration: Skin is not jaundiced.  Findings: No bruising, erythema or rash.  Neurological:     General: No focal deficit present.     Mental Status: He is alert.     Cranial Nerves: No cranial nerve deficit.  Psychiatric:        Mood and Affect: Mood normal.     ED Results / Procedures / Treatments   Labs (all labs ordered are listed, but only abnormal results are displayed) Labs Reviewed  BASIC METABOLIC PANEL - Abnormal; Notable for the following components:      Result Value   Potassium 3.2 (*)    Glucose, Bld 120 (*)    All other components within normal limits  CBC  MAGNESIUM  TROPONIN I (HIGH SENSITIVITY)   TROPONIN I (HIGH SENSITIVITY)    EKG EKG Interpretation  Date/Time:  Monday April 20 2022 12:03:47 EST Ventricular Rate:  106 PR Interval:  156 QRS Duration: 96 QT Interval:  338 QTC Calculation: 448 R Axis:   83 Text Interpretation: Sinus tachycardia Otherwise normal ECG When compared with ECG of 13-Mar-2021 05:18, PREVIOUS ECG IS PRESENT Confirmed by Wandra Arthurs [phone]) on 04/21/2022 7:02:54 PM  Radiology No results found.  Procedures Procedures   Medications Ordered in ED Medications  potassium chloride SA (KLOR-CON M) CR tablet 40 mEq (40 mEq Oral Given 04/20/22 1439)  sodium chloride 0.9 % bolus 1,000 mL (0 mLs Intravenous Stopped 04/20/22 1905)  loperamide (IMODIUM) capsule 4 mg (4 mg Oral Given 04/20/22 1632)    ED Course/ Medical Decision Making/ A&P                            Medical Decision Making Amount and/or Complexity of Data Reviewed Labs: ordered. Radiology: ordered.  Risk Prescription drug management.   This patient presents to the ED for concern of vomiting, diarrhrea, tachycardia. Differential diagnosis includes gastroenteritis, SVT, viral URI, food-borne illness    Additional history obtained:  Additional history obtained from prior ED visits on 03/13/2021 and 02/22/2021 for similar concerns  Lab Tests:  I Ordered, and personally interpreted labs.  The pertinent results include:  Negative troponin, normal magnesium, low potassium 3.2   Imaging Studies ordered:  I ordered imaging studies including chest xray  I independently visualized and interpreted imaging which showed no signs of cardiopulmonary disease I agree with the radiologist interpretation   Medicines ordered and prescription drug management:  I ordered medication including IV fluids, potassium for dehydration, hypokalemia  Reevaluation of the patient after these medicines showed that the patient improved I have reviewed the patients home medicines and have made  adjustments as needed   Problem List / ED Course:  Patient presented to the ED with concerns of palpitations. He reports that he woke up this morning and felt that heart was racing which he confirmed with a home monitor showing resting HR around 140-150bpm. Patient took a dose of metoprolol 50mg  which was able to bring resting HR down to around 100bpm which patient remained around during majority of his time in the ED. Patient had previously been seen in the ED with SVT present which required administration of adenosine to convert to NSR, but as patient's HR appeared to reach a maximum around 120bpm without the presence of a clear arrhythmia, this did not appear appropriate at this time. Patient had hypokalemia addressed with dose of potassium as this was likely a contributing factor in patient's tachycardia. Patient also mentioned that he had been experiencing significant diarrhea  for a few days prior which was likely worsening his volume status, so 1L of NS was given which appeared to aid in maintaining resting HR in the ED between 95-105bpm. Given that patient remained stable during the entirety of the ED visit although he appeared anxious given prior visits, encouraged patient that his symptoms were likely partly associated with his slight dehydration and low potassium, but that he should plan to follow up with cardiology for further evaluation as he may benefit from further testing or medication modifications. Patient was agreeable with treatment plan and verbalized understanding return precautions.  Final Clinical Impression(s) / ED Diagnoses Final diagnoses:  Vomiting and diarrhea  Gastroenteritis  Sinus tachycardia  SVT (supraventricular tachycardia)    Rx / DC Orders ED Discharge Orders          Ordered    ondansetron (ZOFRAN-ODT) 4 MG disintegrating tablet  Every 8 hours PRN        04/20/22 [place]    Ambulatory referral to Cardiology       Comments: If you have not heard from the  Cardiology office within the next 72 hours please call [phone].   04/20/22 1838              Luvenia Heller, PA-C 04/23/22 2206    Milton Ferguson, MD 04/24/22 1053

## 2022-04-20 NOTE — ED Triage Notes (Signed)
Patient states he woke up this morning with his heart racing in the 120s. Patient took 50mg  of metoprolol and now pts hr is 100. Denies SOB / Chest pain at this time. Pt also states he feels like his potassium is low.

## 2022-04-24 ENCOUNTER — Encounter: Payer: Self-pay | Admitting: Internal Medicine

## 2022-04-24 ENCOUNTER — Ambulatory Visit: Payer: Medicaid Other | Admitting: Internal Medicine

## 2022-04-24 VITALS — BP 124/78 | HR 76 | Ht 72.0 in | Wt 348.0 lb

## 2022-04-24 DIAGNOSIS — E876 Hypokalemia: Secondary | ICD-10-CM

## 2022-04-24 DIAGNOSIS — E269 Hyperaldosteronism, unspecified: Secondary | ICD-10-CM | POA: Diagnosis not present

## 2022-04-24 MED ORDER — SPIRONOLACTONE 100 MG PO TABS
100.0000 mg | ORAL_TABLET | Freq: Every day | ORAL | 3 refills | Status: DC
Start: 2022-04-24 — End: 2022-11-05

## 2022-04-24 MED ORDER — POTASSIUM CHLORIDE CRYS ER 20 MEQ PO TBCR: 20.0000 meq | EXTENDED_RELEASE_TABLET | Freq: Two times a day (BID) | ORAL | 3 refills | Status: DC

## 2022-04-24 NOTE — Progress Notes (Signed)
Name: Anthony Huff  MRN/ DOB: [id number], 04/01/1980    Age/ Sex: 42 y.o., male    PCP: [facility]   Reason for Endocrinology Evaluation: Hypokalemia     Date of Initial Endocrinology Evaluation: 04/24/2022     HPI: [name] is a 42 y.o. male with a past medical history of HTN , pre-diabetes and SVT. The patient presented for initial endocrinology clinic visit on 04/24/2022 for consultative assistance with his Hypokalemia .   Pt has been referred by cardiology for further evaluation of hypokalemia.    In review of his chart, the pt has been noted with multiple episode of hypokalemia since 2022  He required multiple ED visits for HTN, Hypokalemia, SVT and SOB over the past 2 years.   No Fh of hypokalemia    24-hour urinary cortisol was normal 04/2021 He was noted with  an  elevated Aldo/renin ratio at 38.9, aldosterone 8.9 NG/DL and plasma renin activity 0.229 NG/mL/Hr  He is status post saline suppression test on 05/2021 His results were inconclusive as his aldosterone went from 12 to 5 after saline infusion.  I did explain to him that if his aldosterone level< 5 this would have excluded hyperaldosteronism , and if it was > 10 then this would have confirmed hyperaldosteronism  CT imaging May/2023 did not reveal any adrenal adenoma       Latest Reference Range & Units 06/03/21 10:21 06/03/21 15:29  ALDOSTERONE  ng/dL 12 5  ALDO / PRA Ratio 0.9 - 28.9 Ratio 34.3 (H) 41.7 (H)  Renin Activity 0.25 - 5.82 ng/mL/h 0.35 0.12 (L)     SUBJECTIVE:    Today (04/24/22): Anthony Huff is here to follow-up on hypokalemia.   He continues with chronic diarrhea 1-2 a day , has left abdominal pain  He denies recent palpitations Denies muscle cramps  Does not eat licorice  Denies headaches or SOB    Spironolactone 50 mg daily  KCl BID     HISTORY:  Past Medical History:  Past Medical History:  Diagnosis Date   COVID-17 April 2020   SVT (supraventricular tachycardia)    Past Surgical History:  Past Surgical History:  Procedure Laterality Date   WISDOM TOOTH EXTRACTION      Social History:  reports that he quit smoking about 3 years ago. His smoking use included cigarettes. He started smoking about 17 years ago. He has never used smokeless tobacco. He reports current drug use. Drug: Marijuana. He reports that he does not drink alcohol. Family History: family history includes Cancer in his father; Diabetes in his mother; Heart disease in his mother; Heart failure in his father.   HOME MEDICATIONS: Allergies as of 04/24/2022       Reactions   Penicillins Shortness Of Breath        Medication List        Accurate as of April 24, 2022 11:28 AM. If you have any questions, ask your nurse or doctor.          magnesium oxide 400 MG tablet Commonly known as: MAG-OX Take 400 mg by mouth daily.   metoprolol succinate 50 MG 24 hr tablet Commonly known as: TOPROL-XL TAKE 1 TABLET(50 MG) BY MOUTH DAILY WITH OR IMMEDIATELY FOLLOWING A MEAL   ondansetron 4 MG disintegrating tablet Commonly known as: ZOFRAN-ODT Take 1 tablet (4 mg total) by mouth every 8 (eight) hours as needed for nausea or vomiting.  potassium chloride SA 20 MEQ tablet Commonly known as: KLOR-CON M Take 1 tablet (20 mEq total) by mouth 2 (two) times daily.   spironolactone 50 MG tablet Commonly known as: ALDACTONE TAKE 1 TABLET(50 MG) BY MOUTH DAILY          REVIEW OF SYSTEMS: A comprehensive ROS was conducted with the patient and is negative except as per HPI    OBJECTIVE:  VS:BP 124/78 (BP Location: Left Arm, Patient Position: Sitting, Cuff Size: Large)   Pulse 76   Ht 6' (1.829 m)   Wt (!) 348 lb (157.9 kg)   SpO2 99%   BMI 47.20 kg/m     Wt Readings from Last 3 Encounters:  04/24/22 (!) 348 lb (157.9 kg)  04/20/22 (!) 345 lb (156.5 kg)  03/02/22 (!) 355 lb 12.8 oz (161.4 kg)     EXAM: General: Pt  appears well and is in NAD  Neck: General: Supple without adenopathy. Thyroid: Thyroid size normal.  No goiter or nodules appreciated.   Lungs: Clear with good BS bilat with no rales, rhonchi, or wheezes  Heart: Auscultation: RRR.  Abdomen: Normoactive bowel sounds, soft, nontender, without masses or organomegaly palpable  Extremities:  BL LE: No pretibial edema normal ROM and strength.  Neuro:  DTRs: 2+ and symmetric in UE without delay in relaxation phase  Mental Status: Judgment, insight: Intact Orientation: Oriented to time, place, and perso Mood and affect: No depression, anxiety, or agitation     DATA REVIEWED:  Latest Reference Range & Units 04/20/22 12:33  Sodium 135 - 145 mmol/L 137  Potassium 3.5 - 5.1 mmol/L 3.2 (L)  Chloride 98 - 111 mmol/L 103  CO2 22 - 32 mmol/L 23  Glucose 70 - 99 mg/dL 120 (H)  BUN 6 - 20 mg/dL 9  Creatinine 0.61 - 1.24 mg/dL 0.63  Calcium 8.9 - 10.3 mg/dL 9.2  Anion gap 5 - 15  11  GFR, Estimated >60 mL/min >60  Troponin I (High Sensitivity) <18 ng/L 5    CT abdomen 08/11/2021   FINDINGS: Lower chest: No acute findings.   Hepatobiliary: No hepatic masses identified. Moderate diffuse hepatic steatosis. Gallbladder is unremarkable. No evidence of biliary ductal dilatation.   Pancreas:  No mass or inflammatory changes.   Spleen:  Within normal limits in size and appearance.   Adrenals/Urinary Tract: No evidence of adrenal mass. Several tiny benign-appearing renal cysts are seen bilaterally (no followup imaging is recommended). No evidence of renal mass or hydronephrosis.   Stomach/Bowel: Unremarkable.   Vascular/Lymphatic: No pathologically enlarged lymph nodes identified. No acute vascular findings.   Other:  None.   Musculoskeletal:  No suspicious bone lesions identified.   IMPRESSION: No evidence of adrenal mass or other acute findings.   Moderate hepatic steatosis.    ASSESSMENT/PLAN/RECOMMENDATIONS:   Primary  hyperaldosteronism:    -High suspicion for hyperaldosteronism -Saline suppression test came back inconclusive we will aldosterone decreasing from 12 to 5.  -CT scan did not show any adrenal adenoma - Recent labs show low K+ - Will increase Spironolactone as below  - I wonder if he still needs Toprol 50 or could this be reduced to 25 mg by cardiology, I have asked him to check with them   Medication  Increase spironolactone 100 mg daily   2. Hypokalemia :   - Recent K+ was low  - this is partly due to chronic diarrhea  - Will increase spironolactone as above   Continue  Kcl 20 mEq to  twice daily   F/U in 6 months   Signed electronically by: Mack Guise, MD  [facility]  [facility] [address] [place], [place] [address] Phone: [phone] FAX: [phone]   CC: [place], [place] [address] Phone: [phone] Fax: [phone]   Return to Endocrinology clinic as below: Future Appointments  Date Time Provider [place]  05/11/2022  9:00 AM Thomes Dinning, MD [facility] None

## 2022-04-24 NOTE — Patient Instructions (Signed)
-  Continue  Potassium to two tablets a day  - Increase Spironolactone 100 mg daily

## 2022-05-04 ENCOUNTER — Encounter: Payer: Self-pay | Admitting: Internal Medicine

## 2022-05-11 ENCOUNTER — Encounter (INDEPENDENT_AMBULATORY_CARE_PROVIDER_SITE_OTHER): Payer: Medicaid Other | Admitting: Internal Medicine

## 2022-05-11 ENCOUNTER — Encounter (INDEPENDENT_AMBULATORY_CARE_PROVIDER_SITE_OTHER): Payer: Self-pay

## 2022-05-13 IMAGING — DX DG CHEST 1V PORT
2 series · 2 of 2 positions shown · non-contrast
Comparison: Chest x-ray 08/04/2020

CLINICAL DATA: Shortness of breath, chest pain

EXAM:
PORTABLE CHEST 1 VIEW

[chest ap (1 of 2)]
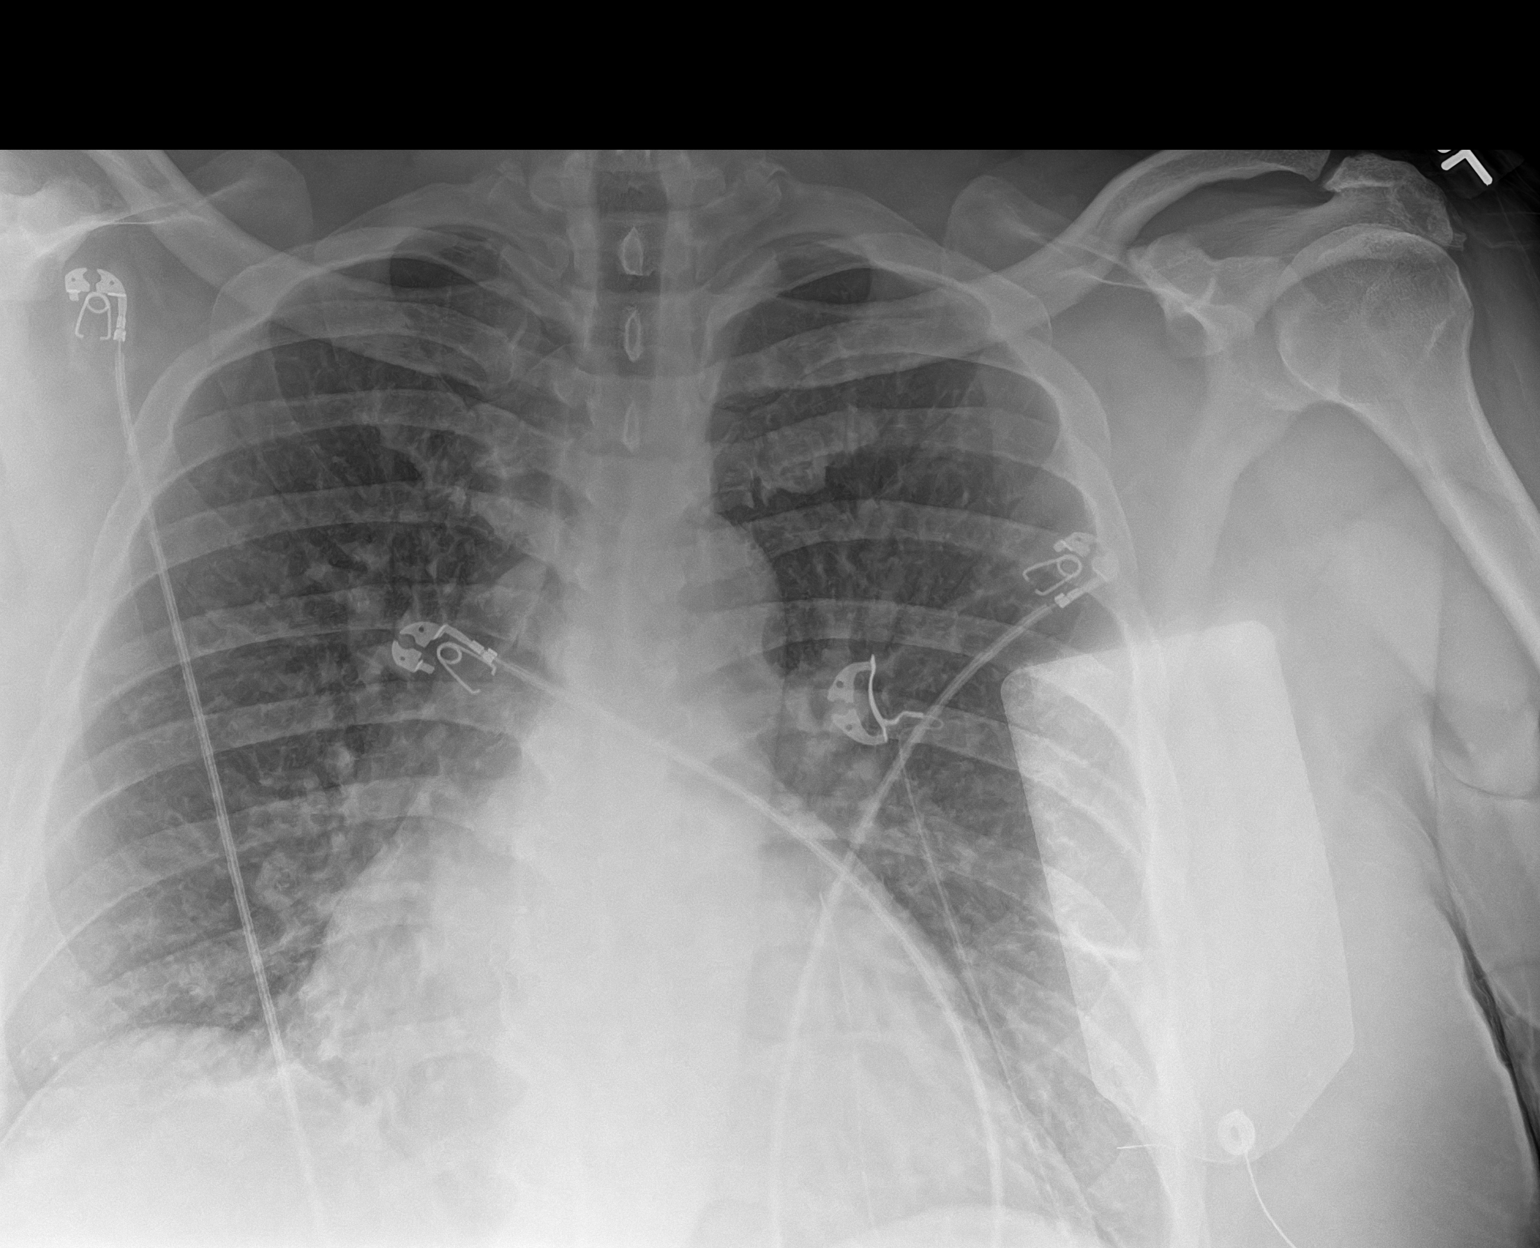

[chest ap (2 of 2)]
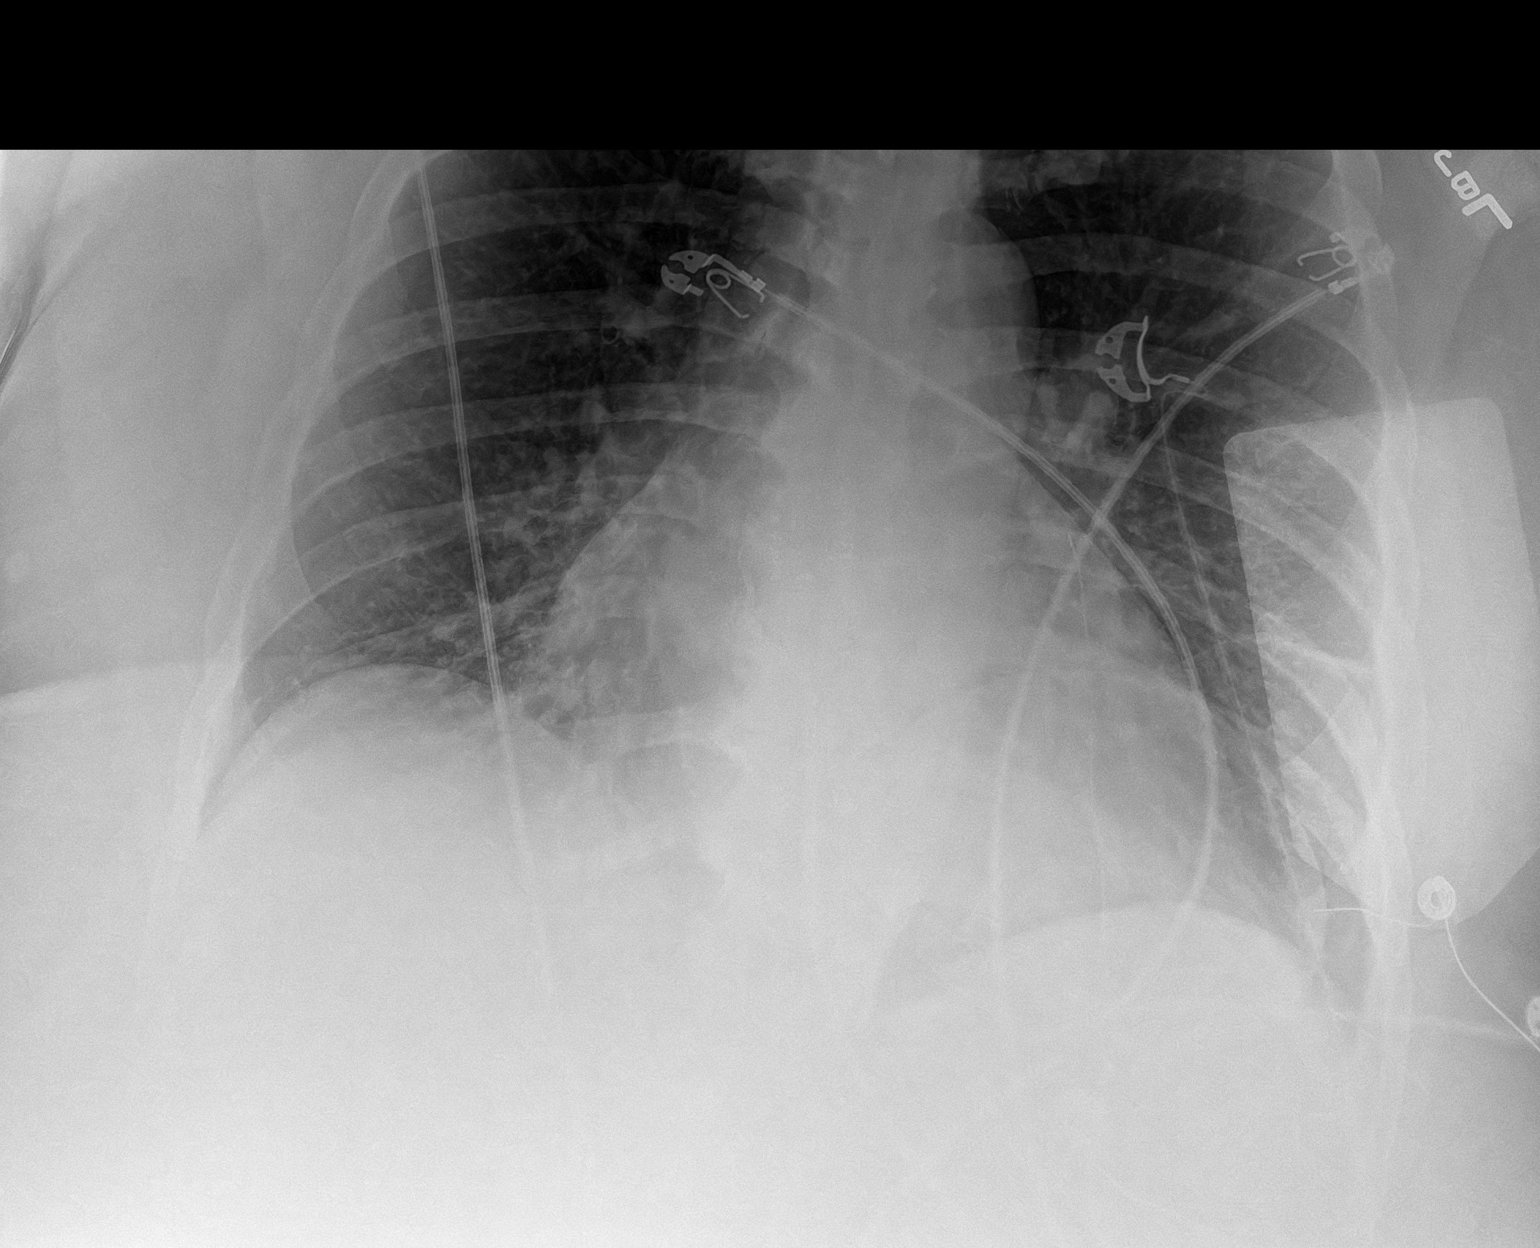

[2 of 2 positions shown; findings below may reference images not displayed]

FINDINGS: Slightly more prominent cardiac silhouette likely due to AP portable
technique. Otherwise the heart and mediastinal contours are
unchanged.

No focal consolidation. Slightly more prominent interstitial
markings. No pleural effusion. No pneumothorax.

No acute osseous abnormality.
IMPRESSION: Mild pulmonary edema.

## 2022-07-03 ENCOUNTER — Ambulatory Visit: Payer: Medicaid Other | Attending: Physician Assistant | Admitting: Student

## 2022-07-03 ENCOUNTER — Encounter: Payer: Self-pay | Admitting: Student

## 2022-07-03 VITALS — BP 136/84 | HR 82 | Ht 72.0 in | Wt 346.0 lb

## 2022-07-03 DIAGNOSIS — I10 Essential (primary) hypertension: Secondary | ICD-10-CM | POA: Diagnosis not present

## 2022-07-03 DIAGNOSIS — E269 Hyperaldosteronism, unspecified: Secondary | ICD-10-CM

## 2022-07-03 DIAGNOSIS — I471 Supraventricular tachycardia, unspecified: Secondary | ICD-10-CM | POA: Diagnosis not present

## 2022-07-03 NOTE — Progress Notes (Signed)
Cardiology Office Note    Date:  07/03/2022  ID:  Anthony Huff, DOB March 24, 1981, MRN [id number] Cardiologist: Nona DellSamuel McDowell, MD    History of Present Illness:    Anthony Huff is a 42 y.o. male with past medical history of SVT (in the setting of electrolyte abnormalities), hyperaldosteronism and HTN who presents to the office today for Emergency Department follow-up.   He was examined by Jacolyn ReedyMichele Lenze, PA-C in 02/2022 and reported occasional palpitations but only noticed this when he would run out of his medications. He had recently been started on Spironolactone given hyperaldosteronism and this was being managed by Endocrinology. Was continued on Toprol-XL 50 mg daily.  In the interim, he presented to [facility] ED on 04/20/2022 for evaluation of palpitations. His heart rate had been elevated in the 150's when checked at home and he took a dose of Metoprolol 50 mg and his heart rate was in the 110's to 120's while in the ED. He was found to be hypokalemic and reported having diarrhea for several days prior to evaluation. He was given fluids along with potassium supplementation and discharged home.  In talking with the patient today, he reports his heart rate has overall been well-controlled since recent ED evaluation. He feels like this was secondary to his stomach virus at that time.  Reports his heart rate is typically in the 70's to 80's when checked at home. He denies any progressive dyspnea on exertion or associated chest pain. No specific orthopnea, PND or pitting edema. He did have labs by his PCP last month.  Studies Reviewed:   EKG: EKG is ordered today and demonstrates sinus tachycardia, HR 104 with no acute ST changes.   Echocardiogram: 09/2020 IMPRESSIONS     1. Left ventricular ejection fraction, by estimation, is 60 to 65%. The  left ventricle has normal function. The left ventricle has no regional  wall motion abnormalities. The left ventricular internal cavity size was   moderately dilated. There is mild  left ventricular hypertrophy. Left ventricular diastolic parameters were  normal.   2. Right ventricular systolic function is normal. The right ventricular  size is normal.   3. The mitral valve is normal in structure. Trivial mitral valve  regurgitation.   4. The aortic valve is tricuspid. Aortic valve regurgitation is not  visualized.   5. The inferior vena cava is normal in size with greater than 50%  respiratory variability, suggesting right atrial pressure of 3 mmHg.   Physical Exam:   VS:  BP 136/84   Pulse 82   Ht 6' (1.829 m)   Wt (!) 346 lb (156.9 kg)   SpO2 98%   BMI 46.93 kg/m    Wt Readings from Last 3 Encounters:  07/03/22 (!) 346 lb (156.9 kg)  04/24/22 (!) 348 lb (157.9 kg)  04/20/22 (!) 345 lb (156.5 kg)     GEN: Pleasant male appearing in no acute distress NECK: No JVD; No carotid bruits CARDIAC: RRR, no murmurs, rubs, gallops RESPIRATORY:  Clear to auscultation without rales, wheezing or rhonchi  ABDOMEN: Appears non-distended. No obvious abdominal masses. EXTREMITIES: No clubbing or cyanosis. No pitting edema.  Distal pedal pulses are 2+ bilaterally.   Assessment and Plan:   1. SVT - He reports his palpitations have overall been well-controlled since prior Emergency Department visit earlier this year. HR was initially at 104 bpm today but improved to 82 bpm on recheck. Will continue current medical therapy with Toprol-XL 50 mg daily.  2. HTN - His BP is at 136/84 during today's visit. Continue current medical therapy with Toprol-XL 50mg  daily and Spironolactone 100mg  daily.   3. Primary Hyperaldosteronism - Followed by Endocrinology and prior CT imaging did not show any evidence of an adrenal adenoma. He remains on Spironolactone 100 mg daily along with K-Dur 20 mEq twice daily. K+ had normalized to 4.0 when checked last month.   Signed, Ellsworth Lennox, PA-C

## 2022-07-03 NOTE — Patient Instructions (Signed)
Medication Instructions:  Your physician recommends that you continue on your current medications as directed. Please refer to the Current Medication list given to you today.  *If you need a refill on your cardiac medications before your next appointment, please call your pharmacy*   Lab Work:  NONE  If you have labs (blood work) drawn today and your tests are completely normal, you will receive your results only by: MyChart Message (if you have MyChart) OR A paper copy in the mail If you have any lab test that is abnormal or we need to change your treatment, we will call you to review the results.   Testing/Procedures: NONE    Follow-Up: At [facility], you and your health needs are our priority.  As part of our continuing mission to provide you with exceptional heart care, we have created designated Provider Care Teams.  These Care Teams include your primary Cardiologist (physician) and Advanced Practice Providers (APPs -  Physician Assistants and Nurse Practitioners) who all work together to provide you with the care you need, when you need it.  We recommend signing up for the patient portal called "MyChart".  Sign up information is provided on this After Visit Summary.  MyChart is used to connect with patients for Virtual Visits (Telemedicine).  Patients are able to view lab/test results, encounter notes, upcoming appointments, etc.  Non-urgent messages can be sent to your provider as well.   To learn more about what you can do with MyChart, go to [email or web address].    Your next appointment:   6 month(s)  Provider:   Brittany Strader, PA-C    Other Instructions Thank you for choosing [facility]!    

## 2022-07-14 ENCOUNTER — Ambulatory Visit: Payer: Medicaid Other | Admitting: Physician Assistant

## 2022-10-27 IMAGING — CT CT ABDOMEN WO/W CM
3 of 10 series · 11 of 46 positions shown, 16 images · IV contrast (APPLIED)
Comparison: None Available.

CLINICAL DATA: Hyperaldosteronism. Hypokalemia. Suspected adrenal
mass.

EXAM:
CT ABDOMEN WITHOUT AND WITH CONTRAST
TECHNIQUE: Multidetector CT imaging of the abdomen was performed following the
standard protocol before and following the bolus administration of
intravenous contrast.

[Series 2: adrenal · axial · 1.27mm/px · z∈[+1024,+1238]mm · 8 of 139 slices shown, 13 images]
[im 16/139  soft-tissue]
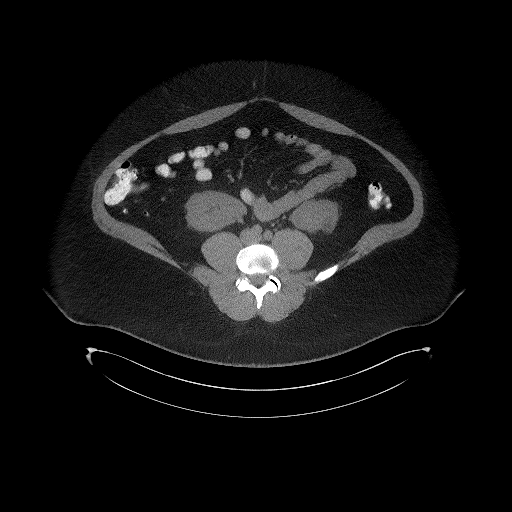
[im 16/139  bone]
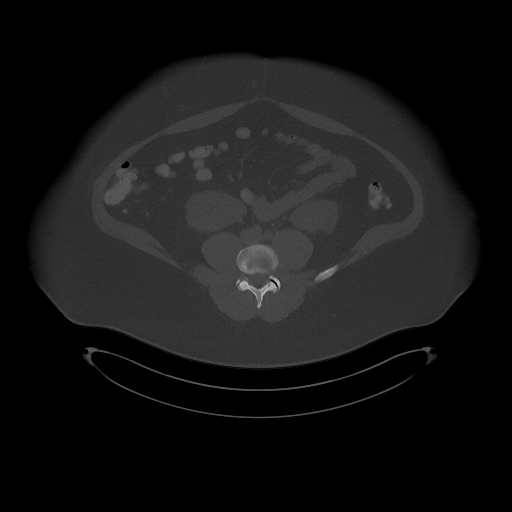
[im 31/139  soft-tissue]
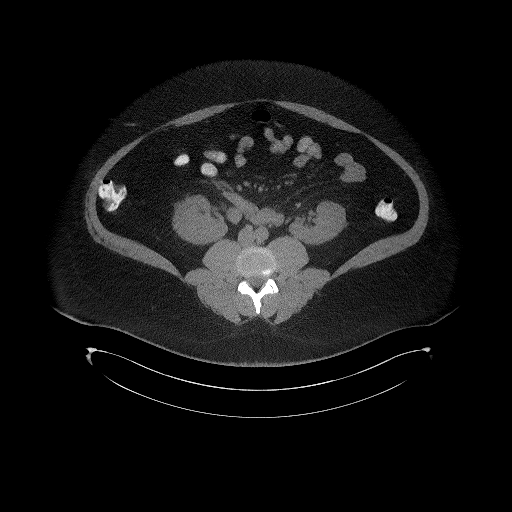
[im 47/139  soft-tissue]
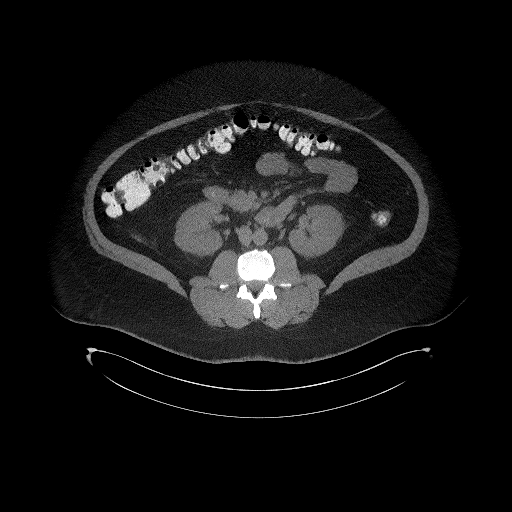
[im 62/139  soft-tissue]
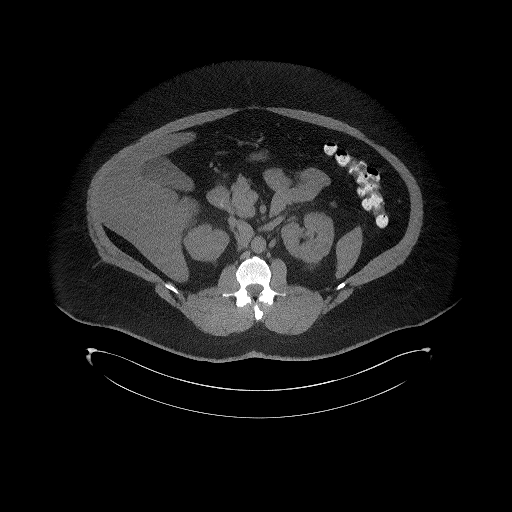
[im 77/139  soft-tissue]
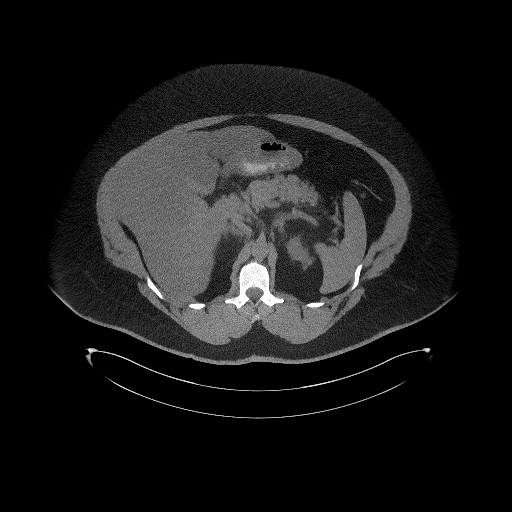
[im 77/139  lung]
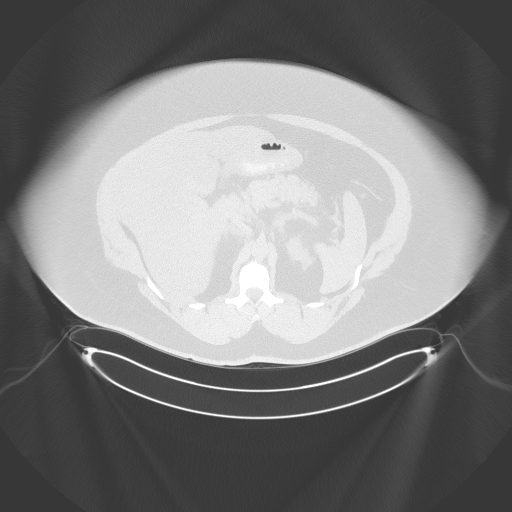
[im 93/139  soft-tissue]
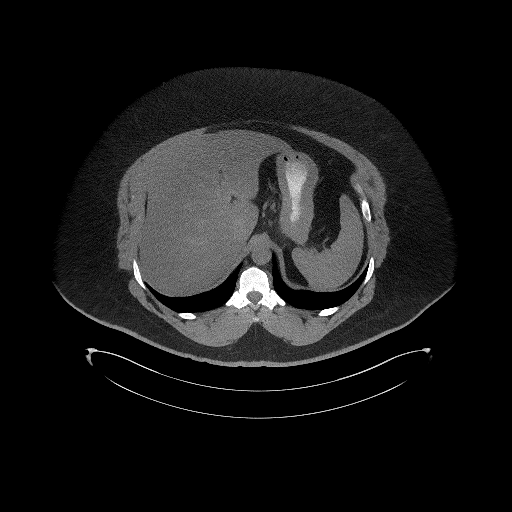
[im 93/139  lung]
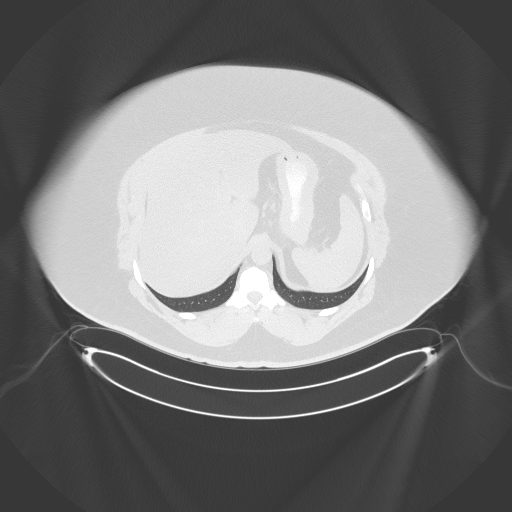
[im 108/139  soft-tissue]
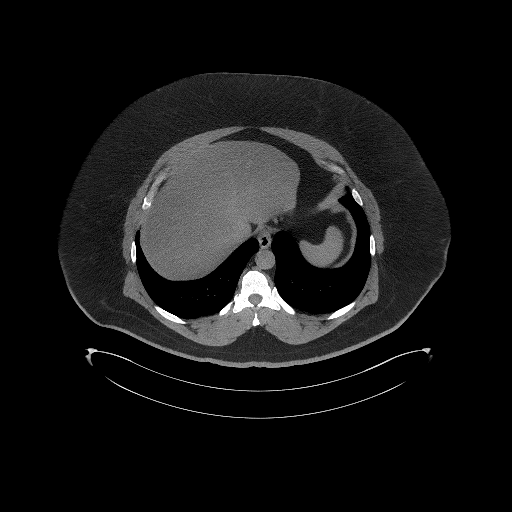
[im 108/139  lung]
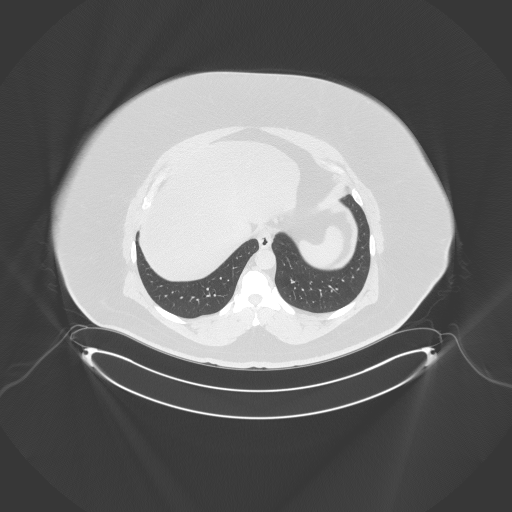
[im 123/139  soft-tissue]
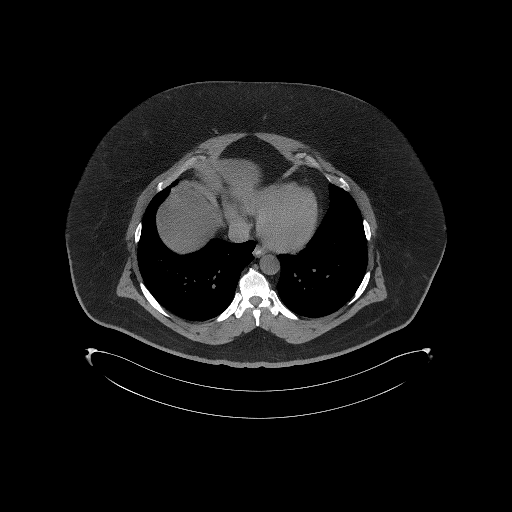
[im 123/139  lung]
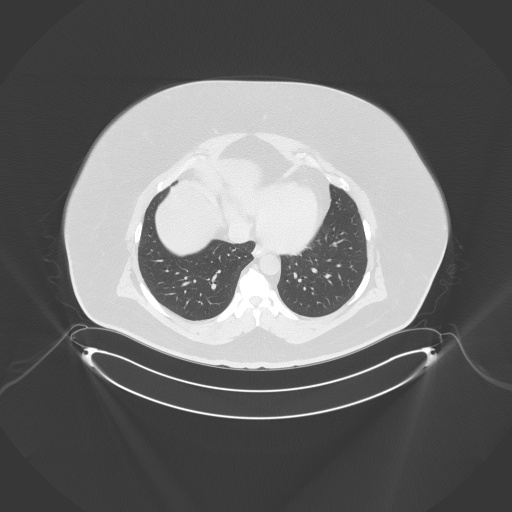

[Series 5: coronal · coronal · 0.58mm/px · 2 of 198 slices shown]
[im 66/198  soft-tissue]
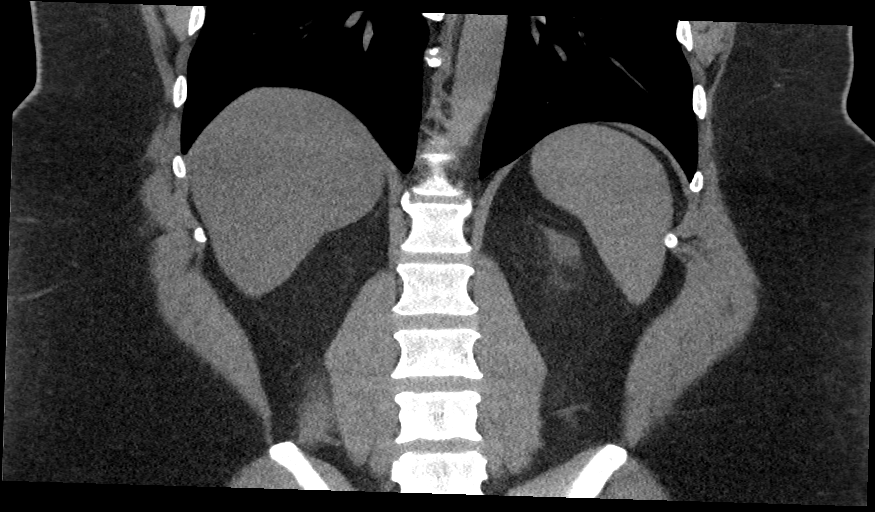
[im 132/198  soft-tissue]
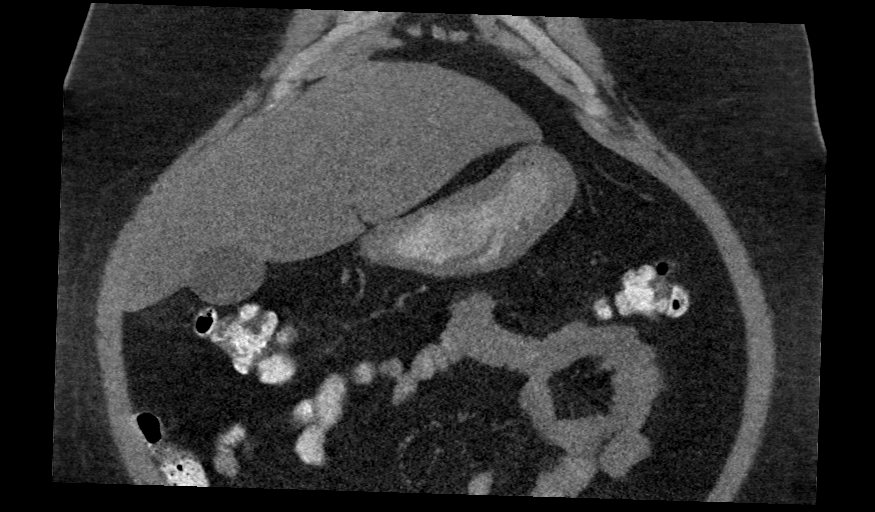

[Series 11: sagittal · sagittal · 0.59mm/px · 1 of 253 slices shown]
[im 54/253  soft-tissue]
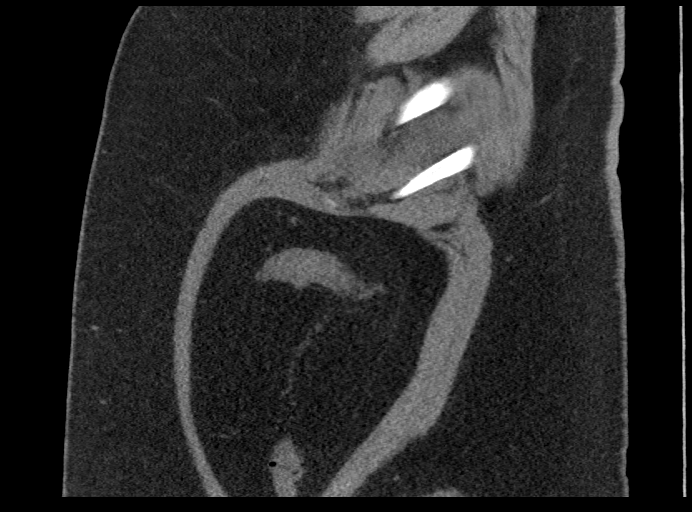

[11 of 46 positions shown; findings below may reference images not displayed]

RADIATION DOSE REDUCTION: This exam was performed according to the
departmental dose-optimization program which includes automated
exposure control, adjustment of the mA and/or kV according to
patient size and/or use of iterative reconstruction technique.

CONTRAST:  100mL OMNIPAQUE IOHEXOL 300 MG/ML  SOLN
FINDINGS: Lower chest: No acute findings.

Hepatobiliary: No hepatic masses identified. Moderate diffuse
hepatic steatosis. Gallbladder is unremarkable. No evidence of
biliary ductal dilatation.

Pancreas:  No mass or inflammatory changes.

Spleen:  Within normal limits in size and appearance.

Adrenals/Urinary Tract: No evidence of adrenal mass. Several tiny
benign-appearing renal cysts are seen bilaterally (no followup
imaging is recommended). No evidence of renal mass or
hydronephrosis.

Stomach/Bowel: Unremarkable.

Vascular/Lymphatic: No pathologically enlarged lymph nodes
identified. No acute vascular findings.

Other:  None.

Musculoskeletal:  No suspicious bone lesions identified.
IMPRESSION: No evidence of adrenal mass or other acute findings.

Moderate hepatic steatosis.

## 2022-10-28 ENCOUNTER — Ambulatory Visit: Payer: Medicaid Other | Admitting: Internal Medicine

## 2022-10-28 NOTE — Progress Notes (Deleted)
Name: Anthony Huff  MRN/ DOB: [id number], [birth year]    Age/ Sex: 42 y.o., male    PCP: [facility]   Reason for Endocrinology Evaluation: Hypokalemia     Date of Initial Endocrinology Evaluation: 10/28/2022     HPI: Anthony Huff is a 42 y.o. male with a past medical history of HTN , pre-diabetes and SVT. The patient presented for initial endocrinology clinic visit on 10/28/2022 for consultative assistance with his Hypokalemia .   Pt has been referred by cardiology for further evaluation of hypokalemia.    In review of his chart, the pt has been noted with multiple episode of hypokalemia since 2022  He required multiple ED visits for HTN, Hypokalemia, SVT and SOB over the past 2 years.   No Fh of hypokalemia    24-hour urinary cortisol was normal 04/2021 He was noted with  an  elevated Aldo/renin ratio at 38.9, aldosterone 8.9 NG/DL and plasma renin activity 0.229 NG/mL/Hr  He is status post saline suppression test on 05/2021 His results were inconclusive as his aldosterone went from 12 to 5 after saline infusion.  I did explain to him that if his aldosterone level< 5 this would have excluded hyperaldosteronism , and if it was > 10 then this would have confirmed hyperaldosteronism  CT imaging May/2023 did not reveal any adrenal adenoma       Latest Reference Range & Units 06/03/21 10:21 06/03/21 15:29  ALDOSTERONE  ng/dL 12 5  ALDO / PRA Ratio 0.9 - 28.9 Ratio 34.3 (H) 41.7 (H)  Renin Activity 0.25 - 5.82 ng/mL/h 0.35 0.12 (L)     SUBJECTIVE:    Today (10/28/22): Anthony Huff is here to follow-up on hypokalemia.   He continues with chronic diarrhea 1-2 a day , has left abdominal pain  He denies recent palpitations Denies muscle cramps  Does not eat licorice  Denies headaches or SOB    Spironolactone 100 mg daily  KCl BID     HISTORY:  Past Medical History:  Past Medical History:  Diagnosis Date   COVID-17 April 2020   SVT (supraventricular tachycardia)    Past Surgical History:  Past Surgical History:  Procedure Laterality Date   WISDOM TOOTH EXTRACTION      Social History:  reports that he quit smoking about 3 years ago. His smoking use included cigarettes. He started smoking about 17 years ago. He has never used smokeless tobacco. He reports current drug use. Drug: Marijuana. He reports that he does not drink alcohol. Family History: family history includes Cancer in his father; Diabetes in his mother; Heart disease in his mother; Heart failure in his father.   HOME MEDICATIONS: Allergies as of 10/28/2022       Reactions   Penicillins Shortness Of Breath        Medication List        Accurate as of October 28, 2022  7:12 AM. If you have any questions, ask your nurse or doctor.          metoprolol succinate 50 MG 24 hr tablet Commonly known as: TOPROL-XL TAKE 1 TABLET(50 MG) BY MOUTH DAILY WITH OR IMMEDIATELY FOLLOWING A MEAL   potassium chloride SA 20 MEQ tablet Commonly known as: KLOR-CON M Take 1 tablet (20 mEq total) by mouth 2 (two) times daily.   spironolactone 100 MG tablet Commonly known as: ALDACTONE Take 1 tablet (100 mg total) by mouth daily.  REVIEW OF SYSTEMS: A comprehensive ROS was conducted with the patient and is negative except as per HPI    OBJECTIVE:  ZO:XWRUE were no vitals taken for this visit.    Wt Readings from Last 3 Encounters:  07/03/22 (!) 346 lb (156.9 kg)  04/24/22 (!) 348 lb (157.9 kg)  04/20/22 (!) 345 lb (156.5 kg)     EXAM: General: Pt appears well and is in NAD  Neck: General: Supple without adenopathy. Thyroid: Thyroid size normal.  No goiter or nodules appreciated.   Lungs: Clear with good BS bilat with no rales, rhonchi, or wheezes  Heart: Auscultation: RRR.  Abdomen: Normoactive bowel sounds, soft, nontender, without masses or organomegaly palpable  Extremities:  BL LE: No pretibial edema normal ROM and  strength.  Neuro:  DTRs: 2+ and symmetric in UE without delay in relaxation phase  Mental Status: Judgment, insight: Intact Orientation: Oriented to time, place, and perso Mood and affect: No depression, anxiety, or agitation     DATA REVIEWED:  Latest Reference Range & Units 04/20/22 12:33  Sodium 135 - 145 mmol/L 137  Potassium 3.5 - 5.1 mmol/L 3.2 (L)  Chloride 98 - 111 mmol/L 103  CO2 22 - 32 mmol/L 23  Glucose 70 - 99 mg/dL 454 (H)  BUN 6 - 20 mg/dL 9  Creatinine 0.98 - 1.19 mg/dL 1.47  Calcium 8.9 - 82.9 mg/dL 9.2  Anion gap 5 - 15  11  GFR, Estimated >60 mL/min >60  Troponin I (High Sensitivity) <18 ng/L 5    CT abdomen 08/11/2021   FINDINGS: Lower chest: No acute findings.   Hepatobiliary: No hepatic masses identified. Moderate diffuse hepatic steatosis. Gallbladder is unremarkable. No evidence of biliary ductal dilatation.   Pancreas:  No mass or inflammatory changes.   Spleen:  Within normal limits in size and appearance.   Adrenals/Urinary Tract: No evidence of adrenal mass. Several tiny benign-appearing renal cysts are seen bilaterally (no followup imaging is recommended). No evidence of renal mass or hydronephrosis.   Stomach/Bowel: Unremarkable.   Vascular/Lymphatic: No pathologically enlarged lymph nodes identified. No acute vascular findings.   Other:  None.   Musculoskeletal:  No suspicious bone lesions identified.   IMPRESSION: No evidence of adrenal mass or other acute findings.   Moderate hepatic steatosis.    ASSESSMENT/PLAN/RECOMMENDATIONS:   Primary hyperaldosteronism:    -High suspicion for hyperaldosteronism -Saline suppression test came back inconclusive we will aldosterone decreasing from 12 to 5.  -CT scan did not show any adrenal adenoma - Recent labs show low K+ - Will increase Spironolactone as below  - I wonder if he still needs Toprol 50 or could this be reduced to 25 mg by cardiology, I have asked him to check with  them   Medication  Increase spironolactone 100 mg daily   2. Hypokalemia :   - Recent K+ was low  - this is partly due to chronic diarrhea  - Will increase spironolactone as above   Continue  Kcl 20 mEq to twice daily   F/U in 6 months   Signed electronically by: Lyndle Herrlich, MD  [facility]  [facility] [address] [place] [place], [place] [address] Phone: [phone] FAX: [phone]   CC: [facility] [address] [place] [address] Phone: [phone] Fax: [phone]   Return to Endocrinology clinic as below: Future Appointments  Date Time Provider Department Center  10/28/2022  9:50 AM , Konrad Dolores, MD [facility]  None  01/01/2023  2:00 PM Strader, Lennart Pall, PA-C [facility] [place] [facility]

## 2022-11-01 ENCOUNTER — Other Ambulatory Visit: Payer: Self-pay | Admitting: Internal Medicine

## 2022-11-04 ENCOUNTER — Ambulatory Visit: Payer: Medicaid Other | Admitting: Internal Medicine

## 2022-11-04 ENCOUNTER — Encounter: Payer: Self-pay | Admitting: Internal Medicine

## 2022-11-04 VITALS — BP 126/82 | HR 76 | Ht 72.0 in | Wt 355.0 lb

## 2022-11-04 DIAGNOSIS — E269 Hyperaldosteronism, unspecified: Secondary | ICD-10-CM | POA: Diagnosis not present

## 2022-11-04 DIAGNOSIS — E876 Hypokalemia: Secondary | ICD-10-CM | POA: Diagnosis not present

## 2022-11-04 LAB — BASIC METABOLIC PANEL
BUN: 11 mg/dL (ref 6–23)
CO2: 28 mEq/L (ref 19–32)
Calcium: 9.5 mg/dL (ref 8.4–10.5)
Chloride: 103 mEq/L (ref 96–112)
Creatinine, Ser: 0.65 mg/dL (ref 0.40–1.50)
GFR: 115.76 mL/min (ref >60.00)
Glucose, Bld: 97 mg/dL (ref 70–99)
Potassium: 3.5 mEq/L (ref 3.5–5.1)
Sodium: 139 mEq/L (ref 135–145)

## 2022-11-04 NOTE — Patient Instructions (Signed)
Continue spironolactone 100 mg daily I will keep you posted about potassium dose changes if needed

## 2022-11-04 NOTE — Progress Notes (Unsigned)
Name: Anthony Huff  MRN/ DOB: [id number], 1980-06-16    Age/ Sex: 42 y.o., male    PCP: [facility]   Reason for Endocrinology Evaluation: Hypokalemia     Date of Initial Endocrinology Evaluation: 11/04/2022     HPI: [name] is a 42 y.o. male with a past medical history of HTN , pre-diabetes and SVT. The patient presented for initial endocrinology clinic visit on 11/04/2022 for consultative assistance with his Hypokalemia .   Pt has been referred by cardiology for further evaluation of hypokalemia.    In review of his chart, the pt has been noted with multiple episode of hypokalemia since 2022  He required multiple ED visits for HTN, Hypokalemia, SVT and SOB over the past 2 years.   No Fh of hypokalemia    24-hour urinary cortisol was normal 04/2021 He was noted with  an  elevated Aldo/renin ratio at 38.9, aldosterone 8.9 NG/DL and plasma renin activity 0.229 NG/mL/Hr  He is status post saline suppression test on 05/2021 His results were inconclusive as his aldosterone went from 12 to 5 after saline infusion.  I did explain to him that if his aldosterone level< 5 this would have excluded hyperaldosteronism , and if it was > 10 then this would have confirmed hyperaldosteronism  CT imaging May/2023 did not reveal any adrenal adenoma       Latest Reference Range & Units 06/03/21 10:21 06/03/21 15:29  ALDOSTERONE  ng/dL 12 5  ALDO / PRA Ratio 0.9 - 28.9 Ratio 34.3 (H) 41.7 (H)  Renin Activity 0.25 - 5.82 ng/mL/h 0.35 0.12 (L)     SUBJECTIVE:    Today (11/04/22): Anthony Huff is here to follow-up on hypokalemia.   Patient continues to up with cardiology for SVT, he is on Toprol Denies recent palpitations  Denies LE edema  Denies muscle cramps except once when he was stretching  He quit walking  Denies headaches or vision changes  He has been taking potassium daily rather than twice daily      Spironolactone 100 mg daily  KCl  BID     HISTORY:  Past Medical History:  Past Medical History:  Diagnosis Date   COVID-17 April 2020   SVT (supraventricular tachycardia)    Past Surgical History:  Past Surgical History:  Procedure Laterality Date   WISDOM TOOTH EXTRACTION      Social History:  reports that he quit smoking about 3 years ago. His smoking use included cigarettes. He started smoking about 17 years ago. He has never used smokeless tobacco. He reports current drug use. Drug: Marijuana. He reports that he does not drink alcohol. Family History: family history includes Cancer in his father; Diabetes in his mother; Heart disease in his mother; Heart failure in his father.   HOME MEDICATIONS: Allergies as of 11/04/2022       Reactions   Penicillins Shortness Of Breath        Medication List        Accurate as of November 04, 2022  2:20 PM. If you have any questions, ask your nurse or doctor.          metoprolol succinate 50 MG 24 hr tablet Commonly known as: TOPROL-XL TAKE 1 TABLET(50 MG) BY MOUTH DAILY WITH OR IMMEDIATELY FOLLOWING A MEAL   potassium chloride SA 20 MEQ tablet Commonly known as: KLOR-CON M Take 1 tablet (20 mEq total) by mouth 2 (two) times  daily.   spironolactone 100 MG tablet Commonly known as: ALDACTONE Take 1 tablet (100 mg total) by mouth daily.          REVIEW OF SYSTEMS: A comprehensive ROS was conducted with the patient and is negative except as per HPI    OBJECTIVE:  VS:BP 126/82 (BP Location: Left Arm, Patient Position: Sitting, Cuff Size: Large)   Pulse 76   Ht 6' (1.829 m)   Wt (!) 355 lb (161 kg)   SpO2 95%   BMI 48.15 kg/m     Wt Readings from Last 3 Encounters:  11/04/22 (!) 355 lb (161 kg)  07/03/22 (!) 346 lb (156.9 kg)  04/24/22 (!) 348 lb (157.9 kg)     EXAM: General: Pt appears well and is in NAD  Neck: General: Supple without adenopathy. Thyroid: Thyroid size normal.  No goiter or nodules appreciated.   Lungs: Clear with  good BS bilat   Heart: Auscultation: RRR.  Abdomen: soft, nontender  Extremities:  BL LE: No pretibial edema   Mental Status: Judgment, insight: Intact Orientation: Oriented to time, place, and perso Mood and affect: No depression, anxiety, or agitation     DATA REVIEWED: ****   CT abdomen 08/11/2021   FINDINGS: Lower chest: No acute findings.   Hepatobiliary: No hepatic masses identified. Moderate diffuse hepatic steatosis. Gallbladder is unremarkable. No evidence of biliary ductal dilatation.   Pancreas:  No mass or inflammatory changes.   Spleen:  Within normal limits in size and appearance.   Adrenals/Urinary Tract: No evidence of adrenal mass. Several tiny benign-appearing renal cysts are seen bilaterally (no followup imaging is recommended). No evidence of renal mass or hydronephrosis.   Stomach/Bowel: Unremarkable.   Vascular/Lymphatic: No pathologically enlarged lymph nodes identified. No acute vascular findings.   Other:  None.   Musculoskeletal:  No suspicious bone lesions identified.   IMPRESSION: No evidence of adrenal mass or other acute findings.   Moderate hepatic steatosis.    ASSESSMENT/PLAN/RECOMMENDATIONS:   Primary hyperaldosteronism:    -High suspicion for hyperaldosteronism -Saline suppression test came back inconclusive with aldosterone decreasing from 12 to 5.  -CT scan did not show any adrenal adenoma -BP well-controlled  Medication  Continue spironolactone 100 mg daily   2. Hypokalemia :  -Patient admits to taking potassium once daily rather than twice a day  Continue  Kcl 20 mEq to twice daily   F/U in 6 months   Signed electronically by: Lyndle Herrlich, MD  [facility]  [address] [facility] [address] [place] [place], [place] [address] Phone: [phone] FAX: [phone]   CC: [facility] [address] [place] [address] Phone: [phone] Fax:  [phone]   Return to Endocrinology clinic as below: Future Appointments  Date Time Provider Department Center  01/01/2023  2:00 PM Ellsworth Lennox, PA-C [facility] [place] [facility]  05/07/2023  1:20 PM Simmone Cape, Konrad Dolores, MD [facility] None

## 2022-11-05 MED ORDER — SPIRONOLACTONE 100 MG PO TABS: 100.0000 mg | ORAL_TABLET | Freq: Every day | ORAL | 3 refills | Status: AC

## 2022-11-05 MED ORDER — POTASSIUM CHLORIDE CRYS ER 20 MEQ PO TBCR: 20.0000 meq | EXTENDED_RELEASE_TABLET | Freq: Every day | ORAL | 3 refills | Status: AC

## 2023-01-01 ENCOUNTER — Ambulatory Visit: Payer: Medicaid Other | Attending: Student | Admitting: Student

## 2023-01-01 ENCOUNTER — Encounter: Payer: Self-pay | Admitting: Student

## 2023-01-01 VITALS — BP 132/80 | HR 70 | Ht 72.0 in | Wt 347.6 lb

## 2023-01-01 DIAGNOSIS — I10 Essential (primary) hypertension: Secondary | ICD-10-CM

## 2023-01-01 DIAGNOSIS — I471 Supraventricular tachycardia, unspecified: Secondary | ICD-10-CM

## 2023-01-01 DIAGNOSIS — E269 Hyperaldosteronism, unspecified: Secondary | ICD-10-CM | POA: Diagnosis not present

## 2023-01-01 MED ORDER — METOPROLOL SUCCINATE ER 50 MG PO TB24: ORAL_TABLET | ORAL | 3 refills | Status: DC

## 2023-01-01 NOTE — Progress Notes (Signed)
Cardiology Office Note    Date:  01/01/2023  ID:  Anthony Huff, DOB 1980/12/19, MRN [id number] Cardiologist: Nona Dell, MD    History of Present Illness:    Anthony Huff is a 42 y.o. male with past medical history of SVT (in the setting of electrolyte abnormalities), hyperaldosteronism and HTN who presents to the office today for 63-month follow-up.  He was last examined by myself in 06/2022 following a recent ED evaluation as he had been experiencing palpitations at home with heart rate in the 150's and upon arrival to the ED, this had improved into the 110's to 120's as he did take Metoprolol prior to this. At the time of follow-up, he reported overall feeling well and felt like his prior episode of palpitations had been due to having a stomach virus at that time. No changes were made to his medications and he was continued on Toprol-XL 50 mg daily and Spironolactone 100 mg daily.  In talking with the patient today, he reports overall doing well from a cardiac perspective since his last office visit. 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 Sometimes experiences musculoskeletal pain with this. He denies any recent chest pain or persistent palpitations. Some brief episodes at times which spontaneously resolve and typically occur if he has missed a dose of his Toprol-XL. No recent dyspnea on exertion, orthopnea, PND or pitting edema. He has tried to reduce his caffeine intake over the years and only consumes alcohol on a rare occasion.  Studies Reviewed:   EKG: EKG is not ordered today. EKG from 07/03/2022 is reviewed and shows sinus tachycardia, HR 104 with no acute ST changes.   Echocardiogram: 09/2020 IMPRESSIONS     1. Left ventricular ejection fraction, by estimation, is 60 to 65%. The  left ventricle has normal function. The left ventricle has no regional  wall motion abnormalities. The left ventricular internal cavity size was  moderately  dilated. There is mild  left ventricular hypertrophy. Left ventricular diastolic parameters were  normal.   2. Right ventricular systolic function is normal. The right ventricular  size is normal.   3. The mitral valve is normal in structure. Trivial mitral valve  regurgitation.   4. The aortic valve is tricuspid. Aortic valve regurgitation is not  visualized.   5. The inferior vena cava is normal in size with greater than 50%  respiratory variability, suggesting right atrial pressure of 3 mmHg.    Physical Exam:   VS:  BP 132/80 (BP Location: Left Arm, Patient Position: Sitting, Cuff Size: Large)   Pulse 70   Ht 6' (1.829 m)   Wt (!) 347 lb 9.6 oz (157.7 kg)   SpO2 94%   BMI 47.14 kg/m    Wt Readings from Last 3 Encounters:  01/01/23 (!) 347 lb 9.6 oz (157.7 kg)  11/04/22 (!) 355 lb (161 kg)  07/03/22 (!) 346 lb (156.9 kg)     GEN: Pleasant, obese male appearing in no acute distress NECK: No JVD; No carotid bruits CARDIAC: RRR, no murmurs, rubs, gallops RESPIRATORY:  Clear to auscultation without rales, wheezing or rhonchi  ABDOMEN: Appears non-distended. No obvious abdominal masses. EXTREMITIES: No clubbing or cyanosis. No pitting edema.  Distal pedal pulses are 2+ bilaterally.   Assessment and Plan:   1. SVT - He denies any recent palpitations and his heart rate was in the 90's on initial check and improved into the 70's on recheck. He was encouraged  to continue to reduce his caffeine intake. Given no recent symptoms, he will continue Toprol-XL at his current dose of 50 mg daily.  2. HTN - BP is at 132/80 during today's visit. Continue current medical therapy with Toprol-XL 50 mg daily and Spironolactone 100 mg daily.  3. Primary Hyperaldosteronism - Followed by Endocrinology and he remains on Spironolactone 100 mg daily. Labs in 10/2022 showed his potassium had normalized to 3.5 and creatinine was stable at 0.65. He did have repeat labs with his PCP in the interim and  reports he was told his K+ was at 3.4 and that he had missed several doses of K-dur and has since resumed this. Will request a copy of most recent labs.   Signed, Anthony Lennox, PA-C

## 2023-01-01 NOTE — Patient Instructions (Signed)
Medication Instructions:   Continue current medication regimen.   *If you need a refill on your cardiac medications before your next appointment, please call your pharmacy*  Follow-Up: At [facility], you and your health needs are our priority.  As part of our continuing mission to provide you with exceptional heart care, we have created designated Provider Care Teams.  These Care Teams include your primary Cardiologist (physician) and Advanced Practice Providers (APPs -  Physician Assistants and Nurse Practitioners) who all work together to provide you with the care you need, when you need it.  We recommend signing up for the patient portal called "MyChart".  Sign up information is provided on this After Visit Summary.  MyChart is used to connect with patients for Virtual Visits (Telemedicine).  Patients are able to view lab/test results, encounter notes, upcoming appointments, etc.  Non-urgent messages can be sent to your provider as well.   To learn more about what you can do with MyChart, go to [email or web address].    Your next appointment:   1 year(s)  Provider:   You may see Nona Dell, MD or one of the following Advanced Practice Providers on your designated Care Team:   [place], PA-C  Jacolyn Reedy, [place]

## 2023-05-07 ENCOUNTER — Ambulatory Visit: Payer: Medicaid Other | Admitting: Internal Medicine

## 2023-05-07 NOTE — Progress Notes (Deleted)
 Name: Anthony Huff  MRN/ DOB: [id number], [birth year]    Age/ Sex: 43 y.o., male    PCP: [facility]   Reason for Endocrinology Evaluation: Hypokalemia     Date of Initial Endocrinology Evaluation: 05/07/2023     HPI: [name] is a 43 y.o. male with a past medical history of HTN , pre-diabetes and SVT. The patient presented for initial endocrinology clinic visit on 05/07/2023 for consultative assistance with his Hypokalemia .   Pt has been referred by cardiology for further evaluation of hypokalemia.    In review of his chart, the pt has been noted with multiple episode of hypokalemia since 2022  He required multiple ED visits for HTN, Hypokalemia, SVT and SOB over the past 2 years.   No Fh of hypokalemia    24-hour urinary cortisol was normal 04/2021 He was noted with  an  elevated Aldo/renin ratio at 38.9, aldosterone 8.9 NG/DL and plasma renin activity 0.229 NG/mL/Hr  He is status post saline suppression test on 05/2021 His results were inconclusive as his aldosterone went from 12 to 5 after saline infusion.  I did explain to him that if his aldosterone level< 5 this would have excluded hyperaldosteronism , and if it was > 10 then this would have confirmed hyperaldosteronism  CT imaging May/2023 did not reveal any adrenal adenoma       Latest Reference Range & Units 06/03/21 10:21 06/03/21 15:29  ALDOSTERONE  ng/dL 12 5  ALDO / PRA Ratio 0.9 - 28.9 Ratio 34.3 (H) 41.7 (H)  Renin Activity 0.25 - 5.82 ng/mL/h 0.35 0.12 (L)     SUBJECTIVE:    Today (05/07/23): Mr. Day is here to follow-up on hypokalemia.   Patient continues to up with cardiology for SVT, he is on Toprol  Denies recent palpitations  Denies LE edema  Denies muscle cramps except once when he was stretching  He quit walking  Denies headaches or vision changes  He has been taking potassium daily rather than twice daily      Spironolactone  100 mg daily  KCl 20mEq  BID     HISTORY:  Past Medical History:  Past Medical History:  Diagnosis Date   COVID-17 April 2020   Hyperaldosteronism [facility])    SVT (supraventricular tachycardia) (HCC)    Past Surgical History:  Past Surgical History:  Procedure Laterality Date   WISDOM TOOTH EXTRACTION      Social History:  reports that he quit smoking about 4 years ago. His smoking use included cigarettes. He started smoking about 18 years ago. He has never used smokeless tobacco. He reports current drug use. Drug: Marijuana. He reports that he does not drink alcohol. Family History: family history includes Cancer in his father; Diabetes in his mother; Heart disease in his mother; Heart failure in his father.   HOME MEDICATIONS: Allergies as of 05/07/2023       Reactions   Penicillins Shortness Of Breath        Medication List        Accurate as of May 07, 2023  7:06 AM. If you have any questions, ask your nurse or doctor.          metoprolol  succinate 50 MG 24 hr tablet Commonly known as: TOPROL -XL TAKE 1 TABLET(50 MG) BY MOUTH DAILY WITH OR IMMEDIATELY FOLLOWING A MEAL   potassium chloride  SA 20 MEQ tablet Commonly known as: KLOR-CON  M Take 1 tablet (20 mEq  total) by mouth daily.   spironolactone  100 MG tablet Commonly known as: ALDACTONE  Take 1 tablet (100 mg total) by mouth daily.          REVIEW OF SYSTEMS: A comprehensive ROS was conducted with the patient and is negative except as per HPI    OBJECTIVE:  CD:Uyzmz were no vitals taken for this visit.    Wt Readings from Last 3 Encounters:  01/01/23 (!) 347 lb 9.6 oz (157.7 kg)  11/04/22 (!) 355 lb (161 kg)  07/03/22 (!) 346 lb (156.9 kg)     EXAM: General: Pt appears well and is in NAD  Neck: General: Supple without adenopathy. Thyroid : Thyroid  size normal.  No goiter or nodules appreciated.   Lungs: Clear with good BS bilat   Heart: Auscultation: RRR.  Abdomen: soft, nontender  Extremities:  BL LE: No  pretibial edema   Mental Status: Judgment, insight: Intact Orientation: Oriented to time, place, and perso Mood and affect: No depression, anxiety, or agitation     DATA REVIEWED:  Latest Reference Range & Units 11/04/22 14:20  Sodium 135 - 145 mEq/L 139  Potassium 3.5 - 5.1 mEq/L 3.5  Chloride 96 - 112 mEq/L 103  CO2 19 - 32 mEq/L 28  Glucose 70 - 99 mg/dL 97  BUN 6 - 23 mg/dL 11  Creatinine 9.59 - 8.49 mg/dL 9.34  Calcium 8.4 - 89.4 mg/dL 9.5  GFR >39.99 mL/min 115.76    CT abdomen 08/11/2021   FINDINGS: Lower chest: No acute findings.   Hepatobiliary: No hepatic masses identified. Moderate diffuse hepatic steatosis. Gallbladder is unremarkable. No evidence of biliary ductal dilatation.   Pancreas:  No mass or inflammatory changes.   Spleen:  Within normal limits in size and appearance.   Adrenals/Urinary Tract: No evidence of adrenal mass. Several tiny benign-appearing renal cysts are seen bilaterally (no followup imaging is recommended). No evidence of renal mass or hydronephrosis.   Stomach/Bowel: Unremarkable.   Vascular/Lymphatic: No pathologically enlarged lymph nodes identified. No acute vascular findings.   Other:  None.   Musculoskeletal:  No suspicious bone lesions identified.   IMPRESSION: No evidence of adrenal mass or other acute findings.   Moderate hepatic steatosis.    ASSESSMENT/PLAN/RECOMMENDATIONS:   Primary hyperaldosteronism:    -High suspicion for hyperaldosteronism -Saline suppression test came back inconclusive with aldosterone decreasing from 12 to 5.  -CT scan did not show any adrenal adenoma -BP well-controlled -BMP normal  Medication  Continue spironolactone  100 mg daily   2. Hypokalemia :  -Patient admits to taking potassium once daily rather than twice a day -Potassium is normal, continue KCl once daily  Continue  Kcl 20 mEq daily   F/U in 6 months   Signed electronically by: Stefano Redgie Butts,  MD  [facility]  [facility] [address] [place], [place] [address] Phone: [phone] FAX: [phone]   CC: [facility] [address] [place] [address] Phone: [phone] Fax: [phone]   Return to Endocrinology clinic as below: Future Appointments  Date Time Provider Department Center  05/07/2023  1:20 PM Dawsen Krieger, Donell Redgie, MD [id number] None

## 2023-06-24 ENCOUNTER — Encounter: Payer: Self-pay | Admitting: Emergency Medicine

## 2023-06-24 ENCOUNTER — Ambulatory Visit
Admission: EM | Admit: 2023-06-24 | Discharge: 2023-06-24 | Disposition: A | Discharge status: Home / Self Care | Attending: Family Medicine | Admitting: Family Medicine

## 2023-06-24 ENCOUNTER — Other Ambulatory Visit: Payer: Self-pay

## 2023-06-24 DIAGNOSIS — Z202 Contact with and (suspected) exposure to infections with a predominantly sexual mode of transmission: Secondary | ICD-10-CM | POA: Diagnosis present

## 2023-06-24 MED ORDER — METRONIDAZOLE 500 MG PO TABS: 2000.0000 mg | ORAL_TABLET | Freq: Once | ORAL | 0 refills | Status: AC

## 2023-06-24 NOTE — Discharge Instructions (Signed)
 I have sent in the treatment dose for a male trichomonas infection.  Once you complete the medication, I recommend waiting at least 7 days before any sexual contact to ensure that the potential infection has been cleared.  We have sent out a penile swab today to test for gonorrhea chlamydia and trichomonas and we will be in touch if any of these come back positive.  I do recommend having your partner be fully treated and waiting at least 7 days after completing this treatment to ensure that infection is also cleared.

## 2023-06-24 NOTE — ED Provider Notes (Signed)
 [facility]    CSN: [id number] Arrival date & time: 06/24/23  1141      History   Chief Complaint Chief Complaint  Patient presents with   Exposure to STD    HPI Anthony Huff is a 43 y.o. male.   Patient presenting today requesting to be treated for trichomonas as his sexual partner recently tested positive on a screening Pap smear.  States that she has already started her treatment.  He is asymptomatic at this time.    Past Medical History:  Diagnosis Date   COVID-17 April 2020   Hyperaldosteronism [facility])    SVT (supraventricular tachycardia) [facility])     Patient Active Problem List   Diagnosis Date Noted   Hyperaldosteronism (HCC) 06/20/2021   Hypokalemia 02/23/2021   Hypomagnesemia 02/23/2021   Noncompliance w/medication treatment due to intermit use of medication 02/23/2021   SVT (supraventricular tachycardia) (HCC) 02/22/2021    Past Surgical History:  Procedure Laterality Date   WISDOM TOOTH EXTRACTION       Home Medications    Prior to Admission medications   Medication Sig Start Date End Date Taking? Authorizing Provider  metroNIDAZOLE (FLAGYL) 500 MG tablet Take 4 tablets (2,000 mg total) by mouth once for 1 dose. 06/24/23 06/24/23 Yes Particia Nearing, PA-C  metoprolol succinate (TOPROL-XL) 50 MG 24 hr tablet TAKE 1 TABLET(50 MG) BY MOUTH DAILY WITH OR IMMEDIATELY FOLLOWING A MEAL 01/01/23   Strader, [place] M, PA-C  potassium chloride SA (KLOR-CON M) 20 MEQ tablet Take 1 tablet (20 mEq total) by mouth daily. Patient taking differently: Take 20 mEq by mouth daily. 11/05/22   Shamleffer, Konrad Dolores, MD  spironolactone (ALDACTONE) 100 MG tablet Take 1 tablet (100 mg total) by mouth daily. 11/05/22   Shamleffer, Konrad Dolores, MD    Family History Family History  Problem Relation Age of Onset   Heart disease Mother    Diabetes Mother    Cancer Father    Heart failure Father     Social History Social History   Tobacco  Use   Smoking status: Former    Current packs/day: 0.00    Types: Cigarettes    Start date: 03/31/2005    Quit date: 03/31/2019    Years since quitting: 4.2   Smokeless tobacco: Never  Vaping Use   Vaping status: Never Used  Substance Use Topics   Alcohol use: No   Drug use: Yes    Types: Marijuana    Comment: 5 days/month     Allergies   Penicillins   Review of Systems Review of Systems Per HPI  Physical Exam Triage Vital Signs ED Triage Vitals  Encounter Vitals Group     BP 06/24/23 1157 (!) 158/88     Systolic BP Percentile --      Diastolic BP Percentile --      Pulse Rate 06/24/23 1157 83     Resp 06/24/23 1157 16     Temp 06/24/23 1157 98.6 F (37 C)     Temp Source 06/24/23 1157 Oral     SpO2 06/24/23 1157 96 %     Weight --      Height --      Head Circumference --      Peak Flow --      Pain Score 06/24/23 1204 0     Pain Loc --      Pain Education --      Exclude from Growth Chart --  No data found.  Updated Vital Signs BP (!) 158/88 (BP Location: Right Arm)   Pulse 83   Temp 98.6 F (37 C) (Oral)   Resp 16   SpO2 96%   Visual Acuity Right Eye Distance:   Left Eye Distance:   Bilateral Distance:    Right Eye Near:   Left Eye Near:    Bilateral Near:     Physical Exam Vitals and nursing note reviewed.  Constitutional:      Appearance: Normal appearance.  HENT:     Head: Atraumatic.  Eyes:     Extraocular Movements: Extraocular movements intact.     Conjunctiva/sclera: Conjunctivae normal.  Cardiovascular:     Rate and Rhythm: Normal rate.  Pulmonary:     Effort: Pulmonary effort is normal.  Genitourinary:    Comments: GU exam deferred, self swab performed Musculoskeletal:        General: Normal range of motion.     Cervical back: Normal range of motion and neck supple.  Skin:    General: Skin is warm and dry.  Neurological:     General: No focal deficit present.     Mental Status: He is oriented to person, place, and  time.  Psychiatric:        Mood and Affect: Mood normal.        Thought Content: Thought content normal.        Judgment: Judgment normal.      UC Treatments / Results  Labs (all labs ordered are listed, but only abnormal results are displayed) Labs Reviewed  CYTOLOGY, (ORAL, ANAL, URETHRAL) ANCILLARY ONLY    EKG   Radiology No results found.  Procedures Procedures (including critical care time)  Medications Ordered in UC Medications - No data to display  Initial Impression / Assessment and Plan / UC Course  I have reviewed the triage vital signs and the nursing notes.  Pertinent labs & imaging results that were available during my care of the patient were reviewed by me and considered in my medical decision making (see chart for details).     Given known exposure to trichomonas, will treat with metronidazole and await cytology for further evaluation.  Did stressed the importance of waiting at least 7 days after finishing the medication for any sexual contact and making sure her partner was also treated fully.  Follow-up for worsening symptoms.  Final Clinical Impressions(s) / UC Diagnoses   Final diagnoses:  Exposure to trichomonas     Discharge Instructions      I have sent in the treatment dose for a male trichomonas infection.  Once you complete the medication, I recommend waiting at least 7 days before any sexual contact to ensure that the potential infection has been cleared.  We have sent out a penile swab today to test for gonorrhea chlamydia and trichomonas and we will be in touch if any of these come back positive.  I do recommend having your partner be fully treated and waiting at least 7 days after completing this treatment to ensure that infection is also cleared.    ED Prescriptions     Medication Sig Dispense Auth. Provider   metroNIDAZOLE (FLAGYL) 500 MG tablet Take 4 tablets (2,000 mg total) by mouth once for 1 dose. 4 tablet Particia Nearing, [place]      PDMP not reviewed this encounter.   Particia Nearing, [place] 06/24/23 1302

## 2023-06-24 NOTE — ED Triage Notes (Signed)
 Pt reports girlfriend tested positive for "trich" and reports was told by significant other that pt needed to be treated. Pt reports pcp is in [place] and unable to get appt. Pt inquiring about px only.

## 2023-06-25 LAB — CYTOLOGY, (ORAL, ANAL, URETHRAL) ANCILLARY ONLY
Chlamydia: NEGATIVE
Comment: NEGATIVE
Comment: NEGATIVE
Comment: NORMAL
Neisseria Gonorrhea: NEGATIVE
Trichomonas: POSITIVE — AB

## 2023-07-14 ENCOUNTER — Other Ambulatory Visit (HOSPITAL_COMMUNITY): Payer: Self-pay | Admitting: Physician Assistant

## 2023-07-14 DIAGNOSIS — E65 Localized adiposity: Secondary | ICD-10-CM

## 2023-07-26 ENCOUNTER — Ambulatory Visit (HOSPITAL_COMMUNITY)
Admission: RE | Admit: 2023-07-26 | Discharge: 2023-07-26 | Disposition: A | Discharge status: Home / Self Care | Source: Ambulatory Visit | Attending: Physician Assistant | Admitting: Physician Assistant

## 2023-07-26 DIAGNOSIS — E65 Localized adiposity: Secondary | ICD-10-CM | POA: Insufficient documentation

## 2023-07-26 MED ORDER — IOHEXOL 350 MG/ML SOLN
75.0000 mL | Freq: Once | INTRAVENOUS | Status: AC | PRN
  Administered 2023-07-26: 75 mL via INTRAVENOUS

## 2023-11-03 ENCOUNTER — Other Ambulatory Visit (HOSPITAL_COMMUNITY): Payer: Self-pay | Admitting: Physician Assistant

## 2023-11-03 DIAGNOSIS — M25562 Pain in left knee: Secondary | ICD-10-CM

## 2023-11-05 ENCOUNTER — Ambulatory Visit (HOSPITAL_COMMUNITY)
Admission: RE | Admit: 2023-11-05 | Discharge: 2023-11-05 | Disposition: A | Discharge status: Home / Self Care | Source: Ambulatory Visit | Attending: Physician Assistant | Admitting: Physician Assistant

## 2023-11-05 DIAGNOSIS — M25562 Pain in left knee: Secondary | ICD-10-CM | POA: Diagnosis present

## 2023-11-09 ENCOUNTER — Emergency Department (HOSPITAL_COMMUNITY)

## 2023-11-09 ENCOUNTER — Encounter (HOSPITAL_COMMUNITY): Payer: Self-pay

## 2023-11-09 ENCOUNTER — Emergency Department (HOSPITAL_COMMUNITY)
Admission: EM | Admit: 2023-11-09 | Discharge: 2023-11-09 | Disposition: A | Discharge status: Home / Self Care | Attending: Emergency Medicine | Admitting: Emergency Medicine

## 2023-11-09 DIAGNOSIS — Y92512 Supermarket, store or market as the place of occurrence of the external cause: Secondary | ICD-10-CM | POA: Diagnosis not present

## 2023-11-09 DIAGNOSIS — W01198A Fall on same level from slipping, tripping and stumbling with subsequent striking against other object, initial encounter: Secondary | ICD-10-CM | POA: Diagnosis not present

## 2023-11-09 DIAGNOSIS — W19XXXA Unspecified fall, initial encounter: Secondary | ICD-10-CM

## 2023-11-09 DIAGNOSIS — S4991XA Unspecified injury of right shoulder and upper arm, initial encounter: Secondary | ICD-10-CM | POA: Diagnosis present

## 2023-11-09 DIAGNOSIS — Y9389 Activity, other specified: Secondary | ICD-10-CM | POA: Insufficient documentation

## 2023-11-09 DIAGNOSIS — S0990XA Unspecified injury of head, initial encounter: Secondary | ICD-10-CM | POA: Insufficient documentation

## 2023-11-09 DIAGNOSIS — M546 Pain in thoracic spine: Secondary | ICD-10-CM | POA: Diagnosis not present

## 2023-11-09 HISTORY — DX: Essential (primary) hypertension: I10

## 2023-11-09 MED ORDER — METHOCARBAMOL 500 MG PO TABS: 500.0000 mg | ORAL_TABLET | Freq: Three times a day (TID) | ORAL | 0 refills | Status: AC

## 2023-11-09 MED ORDER — HYDROCODONE-ACETAMINOPHEN 5-325 MG PO TABS: ORAL_TABLET | ORAL | 0 refills | Status: AC

## 2023-11-09 NOTE — Discharge Instructions (Signed)
 Apply ice packs on and off to your shoulder apply ice packs on and off to your shoulder.  Avoid heavy lifting reaching or pulling for at least 1 week.  Follow-up with your primary care provider for recheck or with the orthopedic provider listed.  Return to the emergency department if you develop any new or worsening symptoms.

## 2023-11-09 NOTE — ED Provider Notes (Signed)
 [place] EMERGENCY DEPARTMENT AT [facility] Provider Note   CSN: [id number] Arrival date & time: 11/09/23  1118     Patient presents with: Anthony Huff is a 43 y.o. male.    Fall Pertinent negatives include no chest pain, no abdominal pain, no headaches and no shortness of breath.        Anthony Huff is a 43 y.o. male who presents to the Emergency Department complaining of right shoulder and right mid back pain after mechanical fall.  States that he slipped on wet concrete while at a local store and fell back landing on concrete.  He has pain along the right side of his middle back and pain down his right arm and right shoulder.  He describes a numb tingling sensation to his right arm.  Unsure of head injury.  He denies any loss of consciousness or neck pain.  States the numbing tingling sensation to his right arm lasted approximately 4 to 5 minutes.  He denies any headache, dizziness, nausea vomiting, shortness of breath or chest pain.  Denies any pain of his lower back, urine or bowel changes, numbness or weakness of his lower extremities. No chest or back pain with deep inspiration.     Prior to Admission medications   Medication Sig Start Date End Date Taking? Authorizing Provider  metoprolol  succinate (TOPROL -XL) 50 MG 24 hr tablet TAKE 1 TABLET(50 MG) BY MOUTH DAILY WITH OR IMMEDIATELY FOLLOWING A MEAL 01/01/23   Strader, [place] M, PA-C  potassium chloride  SA (KLOR-CON  M) 20 MEQ tablet Take 1 tablet (20 mEq total) by mouth daily. Patient taking differently: Take 20 mEq by mouth daily. 11/05/22   Shamleffer, Ibtehal Jaralla, MD  spironolactone  (ALDACTONE ) 100 MG tablet Take 1 tablet (100 mg total) by mouth daily. 11/05/22   Shamleffer, Ibtehal Jaralla, MD    Allergies: Penicillins    Review of Systems  Constitutional:  Negative for chills and fever.  Eyes:  Negative for visual disturbance.  Respiratory:  Negative for shortness of breath.    Cardiovascular:  Negative for chest pain.  Gastrointestinal:  Negative for abdominal pain, diarrhea, nausea and vomiting.  Genitourinary:  Negative for dysuria.  Musculoskeletal:  Positive for arthralgias and back pain. Negative for joint swelling, neck pain and neck stiffness.  Neurological:  Negative for dizziness, syncope, speech difficulty, weakness, light-headedness, numbness and headaches.  Psychiatric/Behavioral:  Negative for confusion.     Updated Vital Signs BP (!) 158/89 (BP Location: Right Arm)   Pulse 86   Temp 97.8 F (36.6 C) (Oral)   Resp 18   Ht 6' (1.829 m)   Wt (!) 158.8 kg   SpO2 96%   BMI 47.47 kg/m   Physical Exam Vitals and nursing note reviewed.  Constitutional:      General: He is not in acute distress.    Appearance: Normal appearance. He is not toxic-appearing.  HENT:     Head: Atraumatic.  Eyes:     Conjunctiva/sclera: Conjunctivae normal.  Neck:     Trachea: Phonation normal.  Cardiovascular:     Rate and Rhythm: Normal rate and regular rhythm.     Pulses: Normal pulses.  Pulmonary:     Effort: Pulmonary effort is normal.  Abdominal:     Palpations: Abdomen is soft.     Tenderness: There is no abdominal tenderness.  Musculoskeletal:        General: Tenderness and signs of injury present. No swelling. Normal range of  motion.     Right shoulder: Tenderness present. No swelling, deformity or bony tenderness. Normal range of motion. Normal strength. Normal pulse.     Cervical back: Full passive range of motion without pain. No pain with movement, spinous process tenderness or muscular tenderness. Normal range of motion.     Comments: Ttp of the anterior right shoulder joint and along the scapular border.  No bruising, abrasions or edema.  No bony deformity of the right shoulder.  Patient has full range of motion of the shoulder joint.  No midline tenderness of the spine, no abrasions of the back  Grip strengths are strong and symmetrical  bilaterally.  Skin:    Capillary Refill: Capillary refill takes less than 2 seconds.     Findings: No erythema.  Neurological:     General: No focal deficit present.     Mental Status: He is alert.     Sensory: No sensory deficit.     Motor: No weakness.     (all labs ordered are listed, but only abnormal results are displayed) Labs Reviewed - No data to display  EKG: None  Radiology: CT Head Wo Contrast Result Date: 11/09/2023 EXAM: CT HEAD WITHOUT CONTRAST 11/09/2023 01:03:08 PM TECHNIQUE: CT of the head was performed without the administration of intravenous contrast. Automated exposure control, iterative reconstruction, and/or weight based adjustment of the mA/kV was utilized to reduce the radiation dose to as low as reasonably achievable. COMPARISON: None available. CLINICAL HISTORY: Polytrauma, blunt; fall. Pt comes in for a fall. Pt was at [employer] loading his vehicle. When pt stepped on the wet concrete, his right foot slid out from under him. Pt hit his right shoulder and lower back. Pt does not know if he hit his head. Pt denies any LOC. Right should does have a little bit of numbness/ tingling down the arm since the incident. FINDINGS: BRAIN AND VENTRICLES: No acute hemorrhage. Gray-white differentiation is preserved. No hydrocephalus. No extra-axial collection. No mass effect or midline shift. Cavum septum pellucidum. ORBITS: No acute abnormality. SINUSES: Mild circumferential mucosal disease within the sphenoid and left maxillary sinuses. SOFT TISSUES AND SKULL: No acute soft tissue abnormality. No skull fracture. IMPRESSION: 1. No acute intracranial abnormality. 2. Mild circumferential mucosal disease within the sphenoid and left maxillary sinuses. Electronically signed by: evalene coho 11/09/2023 01:54 PM EDT RP Workstation: [id number]   CT Cervical Spine Wo Contrast Result Date: 11/09/2023 EXAM: CT CERVICAL SPINE WITHOUT CONTRAST 11/09/2023 01:03:08 PM TECHNIQUE: CT of the  cervical spine was performed without the administration of intravenous contrast. Multiplanar reformatted images are provided for review. Automated exposure control, iterative reconstruction, and/or weight based adjustment of the mA/kV was utilized to reduce the radiation dose to as low as reasonably achievable. COMPARISON: None available. CLINICAL HISTORY: Neck trauma, focal neuro deficit or paresthesia (Age 58-64y); fall. Pt comes in for a fall. Pt was at [employer] loading his vehicle. When pt stepped on the wet concrete, his right foot slid out from under him. Pt hit his right shoulder and lower back. Pt does not know if he hit his head. Pt denies any LOC. Right should does have a little bit of numbness/ tingling down the arm since the incident. FINDINGS: CERVICAL SPINE: BONES AND ALIGNMENT: There is straightening of the normal cervical lordosis. DEGENERATIVE CHANGES: There are anterior bridging osteophytes present from C3 through C7. SOFT TISSUES: There are numerous shotty cervical lymph nodes present. IMPRESSION: 1. No acute abnormality of the cervical spine related to the reported  trauma. 2. Straightening of the normal cervical lordosis and anterior bridging osteophytes at C3 through C7. Electronically signed by: evalene coho 11/09/2023 01:20 PM EDT RP Workstation: [id number]   DG Shoulder Right Result Date: 11/09/2023 CLINICAL DATA:  Fall. EXAM: RIGHT SHOULDER - 2+ VIEW COMPARISON:  None Available. FINDINGS: There is no evidence of fracture or dislocation. There is no evidence of arthropathy or other focal bone abnormality. Soft tissues are unremarkable. IMPRESSION: No acute fracture or dislocation of the right shoulder. Electronically Signed   By: Harrietta Sherry M.D.   On: 11/09/2023 12:22   DG Lumbar Spine 2-3 Views Result Date: 11/09/2023 CLINICAL DATA:  Fall. EXAM: LUMBAR SPINE - 2-3 VIEW COMPARISON:  None Available. FINDINGS: 5 nonrib bearing lumbar-type vertebral bodies. Vertebral body heights  are maintained. No acute fracture. No static listhesis. Disc spaces are relatively maintained. SI joints are unremarkable. IMPRESSION: No acute fracture or traumatic listhesis of the lumbar spine. Electronically Signed   By: Harrietta Sherry M.D.   On: 11/09/2023 12:22     Procedures   Medications Ordered in the ED - No data to display                                  Medical Decision Making Patient here for evaluation of mechanical fall that occurred earlier today.  States that he slipped and fell on wet concrete at a local store.  Describes falling backwards mainly landing to his right side.  He has pain of his mid back right shoulder area denies any known head injury or LOC.  He had brief episode of numbness and tingling to his right arm extending into his hand.  Lasted 4 to 5 minutes before spontaneously resolving.  Denies any headache, dizziness, nausea vomiting chest pain or shortness of breath.  No pleuritic chest pain, no bony tenderness of the ribs  Suspect musculoskeletal injuries, fracture or dislocation also considered, concussion, subdural hematoma, traumatic subarachnoid hemorrhage cervical radiculopathy also considered  Amount and/or Complexity of Data Reviewed Radiology: ordered.    Details: X-ray right shoulder without acute fracture or dislocation  X-ray of the lumbar spine without acute bony abnormality  CT imaging of the cervical spine and head without evidence of acute intracranial abnormality or acute abnormality of the cervical spine.  There is straightening of the normal cervical lordosis Discussion of management or test interpretation with external provider(s): Patient with reported mechanical fall earlier today.  No reported loss of consciousness.  CT imaging and plain film imaging today reassuring.  He is ambulatory in the department with steady gait.  No focal neurodeficits on my exam.  Reported numbness tingling of the arm resolved prior to ER arrival.  Patient  appears appropriate for discharge at this time, I have given strict return precautions and recommended close outpatient follow-up with orthopedics.  He verbalized understanding agrees with plan.  Risk Prescription drug management.        Final diagnoses:  Fall, initial encounter  Injury of right shoulder, initial encounter    ED Discharge Orders     None          Herlinda Milling, PA-C 11/09/23 1435    Pamella Ozell LABOR, DO 11/17/23 0703

## 2023-11-09 NOTE — ED Triage Notes (Signed)
 Pt comes in for a fall. Pt was at [employer] loading his vehicle. When pt stepped on the wet concrete, his right foot slid out from under him. Pt hit his right shoulder and lower back. Pt does not know if he hit his head. Pt denies any LOC.   Right should does have a little bit of numbness/ tingling down the arm since the incident.   Pt is did miss his Bp dose this morning.

## 2024-01-21 ENCOUNTER — Other Ambulatory Visit: Payer: Self-pay | Admitting: Student

## 2024-02-10 ENCOUNTER — Encounter: Payer: Self-pay | Admitting: Cardiology

## 2024-02-10 ENCOUNTER — Ambulatory Visit: Attending: Cardiology | Admitting: Cardiology

## 2024-02-10 VITALS — BP 142/88 | HR 71 | Ht 72.0 in | Wt 347.2 lb

## 2024-02-10 DIAGNOSIS — I471 Supraventricular tachycardia, unspecified: Secondary | ICD-10-CM | POA: Diagnosis not present

## 2024-02-10 DIAGNOSIS — E269 Hyperaldosteronism, unspecified: Secondary | ICD-10-CM | POA: Insufficient documentation

## 2024-02-10 NOTE — Progress Notes (Signed)
    Cardiology Office Note  Date: 02/10/2024   ID: Anthony Huff, DOB 07/22/1980, MRN [id number]  History of Present Illness: Anthony Huff is a 43 y.o. male last seen in October 2024 by Ms. Strader PA-C, I reviewed her note.  Our last encounter was in 2022.  He is here for a routine visit.  Reports no prolonged palpitations since last January 2024 (seen in the ER at that time).  He continues on Toprol -XL 50 mg daily.  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  He is now working as a arboriculturist at [employer].  Reports no new exertional symptoms.  No sudden dizziness or syncope.  I reviewed his lab work from earlier this year.  I reviewed his ECG today which shows sinus rhythm with nonspecific ST-T changes.  Physical Exam: VS:  BP (!) 142/88   Pulse 71   Ht 6' (1.829 m)   Wt (!) 347 lb 3.2 oz (157.5 kg)   SpO2 94%   BMI 47.09 kg/m , BMI Body mass index is 47.09 kg/m.  Wt Readings from Last 3 Encounters:  02/10/24 (!) 347 lb 3.2 oz (157.5 kg)  11/09/23 (!) 350 lb (158.8 kg)  01/01/23 (!) 347 lb 9.6 oz (157.7 kg)    General: Patient appears comfortable at rest. HEENT: Conjunctiva and lids normal. Neck: Supple, no elevated JVP or carotid bruits. Lungs: Clear to auscultation, nonlabored breathing at rest. Cardiac: Regular rate and rhythm, no S3 or significant systolic murmur, no pericardial rub.  ECG:  An ECG dated 07/03/2022 was personally reviewed today and demonstrated:  Sinus tachycardia.  Labwork:  April 2025: Hemoglobin 15.4, platelets 261, BUN 9, creatinine 0.72, potassium 3.8, AST 22, ALT 24, cholesterol 111, triglycerides 104, HDL 36, LDL 55, TSH 0.657  Other Studies Reviewed Today:  No interval cardiac testing for review today.  Assessment and Plan:  1.  PSVT.  Quiescent on current treatment including Toprol -XL 50 mg daily.  I did talk with him again today about vagal maneuvers.  ECG reviewed.   We will continue with observation.  2.  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  He continues on Aldactone  100 mg daily.  Disposition:  Follow up 1 year.  Signed, Jayson JUDITHANN Sierras, M.D., F.A.C.[facility]

## 2024-02-10 NOTE — Patient Instructions (Signed)
 Medication Instructions:  Your physician recommends that you continue on your current medications as directed. Please refer to the Current Medication list given to you today.   Labwork: None today  Testing/Procedures: None today  Follow-Up: 1 year  Any Other Special Instructions Will Be Listed Below (If Applicable).  If you need a refill on your cardiac medications before your next appointment, please call your pharmacy.
